# Patient Record
Sex: Female | Born: 1955 | Race: White | Hispanic: No | Marital: Single | Smoking: Never smoker
Health system: Southern US, Community
[De-identification: ages and names within clinical notes are randomized; demographics above are authoritative.]

## PROBLEM LIST (undated history)

## (undated) DIAGNOSIS — K219 Gastro-esophageal reflux disease without esophagitis: Secondary | ICD-10-CM

## (undated) DIAGNOSIS — E785 Hyperlipidemia, unspecified: Secondary | ICD-10-CM

## (undated) DIAGNOSIS — G8929 Other chronic pain: Secondary | ICD-10-CM

## (undated) DIAGNOSIS — M1990 Unspecified osteoarthritis, unspecified site: Secondary | ICD-10-CM

## (undated) DIAGNOSIS — F32A Depression, unspecified: Secondary | ICD-10-CM

## (undated) DIAGNOSIS — I341 Nonrheumatic mitral (valve) prolapse: Secondary | ICD-10-CM

## (undated) DIAGNOSIS — J302 Other seasonal allergic rhinitis: Secondary | ICD-10-CM

## (undated) DIAGNOSIS — R519 Headache, unspecified: Secondary | ICD-10-CM

## (undated) DIAGNOSIS — Z87820 Personal history of traumatic brain injury: Secondary | ICD-10-CM

## (undated) DIAGNOSIS — Z8489 Family history of other specified conditions: Secondary | ICD-10-CM

## (undated) DIAGNOSIS — R51 Headache: Secondary | ICD-10-CM

## (undated) DIAGNOSIS — F329 Major depressive disorder, single episode, unspecified: Secondary | ICD-10-CM

## (undated) DIAGNOSIS — S0300XA Dislocation of jaw, unspecified side, initial encounter: Secondary | ICD-10-CM

## (undated) DIAGNOSIS — I10 Essential (primary) hypertension: Secondary | ICD-10-CM

## (undated) HISTORY — DX: Headache, unspecified: R51.9

## (undated) HISTORY — DX: Headache: R51

## (undated) HISTORY — DX: Other chronic pain: G89.29

## (undated) HISTORY — PX: STRABISMUS SURGERY: SHX218

## (undated) HISTORY — PX: OTHER SURGICAL HISTORY: SHX169

---

## 2002-11-10 ENCOUNTER — Encounter: Payer: Self-pay | Admitting: Family Medicine

## 2004-10-28 HISTORY — PX: BREAST BIOPSY: SHX20

## 2007-01-15 ENCOUNTER — Encounter: Payer: Self-pay | Admitting: Family Medicine

## 2008-08-15 ENCOUNTER — Encounter: Payer: Self-pay | Admitting: Family Medicine

## 2008-09-07 ENCOUNTER — Encounter: Payer: Self-pay | Admitting: Family Medicine

## 2009-01-04 ENCOUNTER — Ambulatory Visit: Payer: Self-pay | Admitting: Family Medicine

## 2009-01-04 DIAGNOSIS — F3289 Other specified depressive episodes: Secondary | ICD-10-CM | POA: Insufficient documentation

## 2009-01-04 DIAGNOSIS — I10 Essential (primary) hypertension: Secondary | ICD-10-CM | POA: Insufficient documentation

## 2009-01-04 DIAGNOSIS — R519 Headache, unspecified: Secondary | ICD-10-CM | POA: Insufficient documentation

## 2009-01-04 DIAGNOSIS — R51 Headache: Secondary | ICD-10-CM | POA: Insufficient documentation

## 2009-01-04 DIAGNOSIS — R609 Edema, unspecified: Secondary | ICD-10-CM | POA: Insufficient documentation

## 2009-01-04 DIAGNOSIS — E785 Hyperlipidemia, unspecified: Secondary | ICD-10-CM | POA: Insufficient documentation

## 2009-01-04 DIAGNOSIS — J309 Allergic rhinitis, unspecified: Secondary | ICD-10-CM | POA: Insufficient documentation

## 2009-01-04 DIAGNOSIS — F329 Major depressive disorder, single episode, unspecified: Secondary | ICD-10-CM | POA: Insufficient documentation

## 2009-01-04 DIAGNOSIS — K219 Gastro-esophageal reflux disease without esophagitis: Secondary | ICD-10-CM | POA: Insufficient documentation

## 2009-01-04 LAB — CONVERTED CEMR LAB
Blood in Urine, dipstick: NEGATIVE
Glucose, Urine, Semiquant: NEGATIVE
Specific Gravity, Urine: 1.01
WBC Urine, dipstick: NEGATIVE
pH: 7

## 2009-01-04 LAB — HM COLONOSCOPY: HM Colonoscopy: NORMAL

## 2009-01-05 LAB — CONVERTED CEMR LAB
BUN: 10 mg/dL (ref 6–23)
Calcium: 9.1 mg/dL (ref 8.4–10.5)
Creatinine, Ser: 0.6 mg/dL (ref 0.4–1.2)
GFR calc Af Amer: 136 mL/min
GFR calc non Af Amer: 112 mL/min
Potassium: 3.6 meq/L (ref 3.5–5.1)

## 2009-01-11 ENCOUNTER — Ambulatory Visit: Payer: Self-pay | Admitting: Family Medicine

## 2009-02-08 ENCOUNTER — Ambulatory Visit: Payer: Self-pay | Admitting: Family Medicine

## 2009-02-20 ENCOUNTER — Telehealth: Payer: Self-pay | Admitting: Speech Pathology

## 2009-02-20 DIAGNOSIS — R141 Gas pain: Secondary | ICD-10-CM | POA: Insufficient documentation

## 2009-02-20 DIAGNOSIS — R143 Flatulence: Secondary | ICD-10-CM

## 2009-02-20 DIAGNOSIS — R142 Eructation: Secondary | ICD-10-CM

## 2009-02-27 ENCOUNTER — Encounter: Admission: RE | Admit: 2009-02-27 | Discharge: 2009-02-27 | Payer: Self-pay | Admitting: Family Medicine

## 2009-03-24 ENCOUNTER — Ambulatory Visit: Payer: Self-pay | Admitting: Internal Medicine

## 2009-05-02 ENCOUNTER — Telehealth: Payer: Self-pay | Admitting: Family Medicine

## 2009-06-28 ENCOUNTER — Ambulatory Visit: Payer: Self-pay | Admitting: Family Medicine

## 2009-08-03 ENCOUNTER — Ambulatory Visit: Payer: Self-pay | Admitting: Family Medicine

## 2009-08-03 LAB — CONVERTED CEMR LAB
Albumin: 4.3 g/dL (ref 3.5–5.2)
Basophils Absolute: 0.1 10*3/uL (ref 0.0–0.1)
Basophils Relative: 0.7 % (ref 0.0–3.0)
CO2: 29 meq/L (ref 19–32)
Chloride: 102 meq/L (ref 96–112)
Eosinophils Absolute: 0.8 10*3/uL — ABNORMAL HIGH (ref 0.0–0.7)
Glucose, Bld: 88 mg/dL (ref 70–99)
HCT: 40 % (ref 36.0–46.0)
HDL: 63 mg/dL (ref >39.00)
Hemoglobin, Urine: NEGATIVE
Hemoglobin: 13.4 g/dL (ref 12.0–15.0)
Ketones, ur: NEGATIVE mg/dL
Leukocytes, UA: NEGATIVE
Lymphs Abs: 2.5 10*3/uL (ref 0.7–4.0)
MCHC: 33.4 g/dL (ref 30.0–36.0)
MCV: 92.3 fL (ref 78.0–100.0)
Neutro Abs: 9.5 10*3/uL — ABNORMAL HIGH (ref 1.4–7.7)
Potassium: 3.7 meq/L (ref 3.5–5.1)
RDW: 12.7 % (ref 11.5–14.6)
Sodium: 133 meq/L — ABNORMAL LOW (ref 135–145)
Specific Gravity, Urine: 1.015 (ref 1.000–1.030)
TSH: 1.34 microintl units/mL (ref 0.35–5.50)
Total Protein: 8 g/dL (ref 6.0–8.3)
Urobilinogen, UA: 0.2 (ref 0.0–1.0)

## 2009-08-04 ENCOUNTER — Ambulatory Visit: Payer: Self-pay | Admitting: Family Medicine

## 2009-08-04 DIAGNOSIS — D72829 Elevated white blood cell count, unspecified: Secondary | ICD-10-CM | POA: Insufficient documentation

## 2009-08-07 ENCOUNTER — Telehealth: Payer: Self-pay | Admitting: Family Medicine

## 2009-08-07 ENCOUNTER — Ambulatory Visit: Payer: Self-pay | Admitting: Family Medicine

## 2009-08-07 LAB — CONVERTED CEMR LAB
Basophils Absolute: 0 10*3/uL (ref 0.0–0.1)
Basophils Relative: 0.5 % (ref 0.0–3.0)
Eosinophils Absolute: 0.5 10*3/uL (ref 0.0–0.7)
Lymphocytes Relative: 20.1 % (ref 12.0–46.0)
MCHC: 34 g/dL (ref 30.0–36.0)
MCV: 92 fL (ref 78.0–100.0)
Monocytes Absolute: 0.6 10*3/uL (ref 0.1–1.0)
Neutrophils Relative %: 67.6 % (ref 43.0–77.0)
Platelets: 228 10*3/uL (ref 150.0–400.0)
RBC: 3.94 M/uL (ref 3.87–5.11)

## 2009-08-17 ENCOUNTER — Ambulatory Visit: Payer: Self-pay | Admitting: Family Medicine

## 2009-08-17 DIAGNOSIS — M810 Age-related osteoporosis without current pathological fracture: Secondary | ICD-10-CM | POA: Insufficient documentation

## 2009-09-04 ENCOUNTER — Telehealth: Payer: Self-pay | Admitting: Family Medicine

## 2009-10-23 ENCOUNTER — Telehealth: Payer: Self-pay | Admitting: Family Medicine

## 2010-01-15 ENCOUNTER — Ambulatory Visit: Payer: Self-pay | Admitting: Family Medicine

## 2010-01-15 DIAGNOSIS — S0990XA Unspecified injury of head, initial encounter: Secondary | ICD-10-CM | POA: Insufficient documentation

## 2010-02-02 ENCOUNTER — Telehealth: Payer: Self-pay | Admitting: Family Medicine

## 2010-02-02 ENCOUNTER — Encounter (INDEPENDENT_AMBULATORY_CARE_PROVIDER_SITE_OTHER): Payer: Self-pay | Admitting: *Deleted

## 2010-02-02 ENCOUNTER — Telehealth: Payer: Self-pay | Admitting: Gastroenterology

## 2010-02-02 DIAGNOSIS — K625 Hemorrhage of anus and rectum: Secondary | ICD-10-CM | POA: Insufficient documentation

## 2010-02-07 ENCOUNTER — Ambulatory Visit: Payer: Self-pay | Admitting: Gastroenterology

## 2010-02-07 DIAGNOSIS — K648 Other hemorrhoids: Secondary | ICD-10-CM | POA: Insufficient documentation

## 2010-02-12 ENCOUNTER — Ambulatory Visit: Payer: Self-pay | Admitting: Family Medicine

## 2010-03-13 ENCOUNTER — Telehealth: Payer: Self-pay | Admitting: Family Medicine

## 2010-03-15 ENCOUNTER — Ambulatory Visit: Payer: Self-pay | Admitting: Family Medicine

## 2010-06-29 ENCOUNTER — Ambulatory Visit: Payer: Self-pay | Admitting: Family Medicine

## 2010-08-07 ENCOUNTER — Ambulatory Visit: Payer: Self-pay | Admitting: Family Medicine

## 2010-08-07 DIAGNOSIS — L501 Idiopathic urticaria: Secondary | ICD-10-CM | POA: Insufficient documentation

## 2010-09-17 ENCOUNTER — Telehealth: Payer: Self-pay | Admitting: Family Medicine

## 2010-09-19 ENCOUNTER — Telehealth: Payer: Self-pay | Admitting: Family Medicine

## 2010-10-12 ENCOUNTER — Encounter: Payer: Self-pay | Admitting: Family Medicine

## 2010-10-12 LAB — HM MAMMOGRAPHY: HM Mammogram: NORMAL

## 2010-11-27 ENCOUNTER — Telehealth: Payer: Self-pay | Admitting: Family Medicine

## 2010-11-27 NOTE — Progress Notes (Signed)
Summary: referral  Phone Note Call from Patient   Caller: Patient Call For: Evelena Peat MD Summary of Call: Pt wants referral to a specialist for hemorrhoids.  Please call at [phone] Initial call taken by: Lynann Beaver CMA,  February 02, 2010 10:42 AM  Follow-up for Phone Call        OK to refer-if pt has no preference,  [facility]. Follow-up by: Evelena Peat MD,  February 02, 2010 10:46 AM  New Problems: RECTAL BLEEDING (ICD-569.3)   New Problems: RECTAL BLEEDING (ICD-569.3)

## 2010-11-27 NOTE — Assessment & Plan Note (Signed)
Summary: HEMORRHOIDS,RECTAL BLEEDING    (NEW TO GI)   Gabrielle Brock   History of Present Illness Visit Type: Initial Consult Primary GI MD: Melvia Heaps MD [facility] Primary Provider: Evelena Peat, MD Requesting Provider: Evelena Peat, MD Chief Complaint: rectal bleed, hemorrhoids History of Present Illness:   55 YO FEMALE NEW TO GI TODAY WITH C/O RECTAL BLEEDING. SHE RELATES HX OF INTERNAL HEMORRHOIDS AND HAD UNDERGONE A BANDING PROCEDURE IN [place] SEVERAL YEARS AGO. SHE HAD A COLONOSCOPY IN 10/2002 IN [place](LETTER IN HER CHART) WHICH WAS NORMAL,EXCEPT FOR SMALL INT. HEMORRHOIDS. SHE HAD AN EPISODE ABOUT A WEEK AGO AFTER A LARGE BM WITH  A SMALL AMT OF BRB PER RECTUM. SHE SAW A SMALL AMT OF BLOOD WITH BM'S FOR 3 DAYS THEN IT RESOLVED. SHE HAS NO C/O RECTAL DISCOMFORT,OR ABDOMINAL DISCOMFORT. BM'S NORMAL. NO REGULAR ASA OR NSAIDS. NO FAMILY HX OF COLON CANCER.   GI Review of Systems      Denies abdominal pain, acid reflux, belching, bloating, chest pain, dysphagia with liquids, dysphagia with solids, heartburn, loss of appetite, nausea, vomiting, vomiting blood, and  weight loss.      Reports hemorrhoids and  rectal bleeding.     Denies anal fissure, black tarry stools, change in bowel habit, constipation, diarrhea, diverticulosis, fecal incontinence, heme positive stool, irritable bowel syndrome, jaundice, light color stool, liver problems, and  rectal pain.    Current Medications (verified): 1)  Allegra 60 Mg Tabs (Fexofenadine Hcl) .... Once Daily 2)  Nexium 40 Mg Cpdr (Esomeprazole Magnesium) .... Once Daily 3)  Multi For Her 50+  Tabs (Multiple Vitamins-Minerals) .... Once Daily 4)  Ambien 10 Mg Tabs (Zolpidem Tartrate) .... As Needed 5)  Fioricet 50-325-40 Mg Tabs (Butalbital-Apap-Caffeine) .... As Needed 6)  Xanax 0.25 Mg Tabs (Alprazolam) .... 2 and 1/2 At Bedtime 7)  Nasacort Aq 55 Mcg/act Aers (Triamcinolone Acetonide(Nasal)) .... Qd 8)  Benicar 20 Mg Tabs (Olmesartan  Medoxomil) 9)  Boniva 150 Mg Tabs (Ibandronate Sodium) .... One By Mouth Q Month 10)  Hydrochlorothiazide 25 Mg Tabs (Hydrochlorothiazide) .... One Half Tablet Once Daily As Needed Edema 11)  Maprotiline Hcl 75 Mg Tabs (Maprotiline Hcl) .... Once Daily  Allergies (verified): 1)  Ceclor  Past History:  Past Medical History: Allergic rhinitis Depression GERD Hyperlipidemia Hypertension Migraines Osteoporosis Chronic Headaches internal hemorrhoids  Past Surgical History: Torn Retina Breast Biopsy  Family History: Reviewed history from 01/04/2009 and no changes required. Family History High cholesterol Family History Hypertension Family History of Cardiovascular disorder Parkinsons  father Alzhemiers  mother  Social History: Reviewed history from 01/04/2009 and no changes required. Occupation:  Designer, multimedia Single Never Smoked Alcohol use-no Regular exercise-yes  Review of Systems       The patient complains of allergy/sinus, depression-new, headaches-new, hearing problems, nosebleeds, and sore throat.  The patient denies anemia, anxiety-new, arthritis/joint pain, back pain, blood in urine, breast changes/lumps, change in vision, confusion, cough, coughing up blood, fainting, fatigue, heart murmur, heart rhythm changes, itching, menstrual pain, muscle pains/cramps, night sweats, pregnancy symptoms, shortness of breath, skin rash, sleeping problems, swelling of feet/legs, swollen lymph glands, thirst - excessive , urination - excessive , urination changes/pain, urine leakage, vision changes, and voice change.         ros otherwise as in hpi  Vital Signs:  Patient profile:   55 year old female Menstrual status:  regular Height:      62 inches Weight:      108 pounds BMI:  19.82 Pulse rate:   92 / minute Pulse rhythm:   regular BP sitting:   110 / 74  (left arm) Cuff size:   regular  Vitals Entered By: June McMurray CMA Duncan Dull) (February 07, 2010 8:52  AM)  Physical Exam  General:  Well developed, well nourished, no acute distress.,THIN Head:  Normocephalic and atraumatic. Eyes:  PERRLA, no icterus. Lungs:  Clear throughout to auscultation. Heart:  Regular rate and rhythm; no murmurs, rubs,  or bruits. Abdomen:  SOFT, NONTENDER, NO MASS OR HSM,BS+ Rectal:  SMALL EXTERNAL TAG, NO INTERNAL LESION FELT, NONTENDER EXAM,STOOL HEME NEGATIVE Extremities:  No clubbing, cyanosis, edema or deformities noted. Neurologic:  Alert and  oriented x4;  grossly normal neurologically. Psych:  Alert and cooperative. Normal mood and affect.anxious.     Impression & Recommendations:  Problem # 1:  RECTAL BLEEDING (ICD-569.3) Assessment New 55 YO FEMALE WITH HX OF PREVIOUSLY DOCUMENTED INTERNAL HEMORRHOIDS ON COLONOSCOPY 2004 (DONE IN [place]), WITH EPISODE OF SMALL VOLUME RECTAL BLEEDING,VERY LIKELY SECONDARY TO HEMORRHOID.  PT INITIALLY EXPRESSED DESIRE TO UNDERGO  A BANDING PROCEDURE-HOWEVER, SHE IS CURRENTLY ASYMPTOMATIC. DO NOT FEEL SHE NEEDS A BANDING AT THIS TIME,DISCUSSED TRIAL OF as needed ANUSOL HC SUPP. IF SHE HAS PROBLEMS WITH PERSISTENT BLEEDING OR DISCOMFORT THEN WILL NEED COLONOSCOPY REPEATED AND CONSIDER BANDING AT THAT TIME. SHE WILL CALL IF SHE HAS RECURRENT PROBLEMS.  Patient Instructions: 1)  Return for recurrent bleeding with Dr. Arlyce Dice or Mike Gip PA. 2)  Get Hydrocortisone Suppositories at your pharmacy, use for 5-6 nights before bedtime.  3)  The medication list was reviewed and reconciled.  All changed / newly prescribed medications were explained.  A complete medication list was provided to the patient / caregiver. 4)  Hemorrhoids brochure given.  5)  Copy sent to : Evelena Peat, MD
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
## 2010-11-27 NOTE — Progress Notes (Signed)
Summary: refill and new rx dose request  Phone Note Refill Request Call back at [phone] Message from:  Patient---live call  Refills Requested: Medication #1:  BENICAR 20 MG TABS send to [facility] (Benicar) on Nexium 40mg . would like to try 20mg  1qd. Please call this in to [facility] in [place].  Initial call taken by: Warnell Forester,  Mar 13, 2010 1:49 PM  Follow-up for Phone Call        OK to try Nexium 20 mg by mouth once daily with 6 refills. Follow-up by: Evelena Peat MD,  Mar 13, 2010 10:12 PM    New/Updated Medications: NEXIUM 20 MG CPDR (ESOMEPRAZOLE MAGNESIUM) once daily BENICAR 20 MG TABS (OLMESARTAN MEDOXOMIL) once daily Prescriptions: BENICAR 20 MG TABS (OLMESARTAN MEDOXOMIL) once daily  #90 x 3   Entered by:   Sid Falcon LPN   Authorized by:   Evelena Peat MD   Signed by:   Sid Falcon LPN on 09/81/1914   Method used:   Faxed to ...       [facility] (mail[facility]order)       [address] [place], [place]  [address]       Ph: [phone]       Fax: [phone]   RxID:   [id number] BENICAR 20 MG TABS (OLMESARTAN MEDOXOMIL) once daily  #30 x 6   Entered by:   Sid Falcon LPN   Authorized by:   Evelena Peat MD   Signed by:   Sid Falcon LPN on 40/34/7425   Method used:   Faxed to ...       [facility] (mail[facility]order)       [address] [place], [place]  [address]       Ph: [phone]       Fax: [phone]   RxID:   [id number] NEXIUM 20 MG CPDR (ESOMEPRAZOLE MAGNESIUM) once daily  #30 x 6   Entered by:   Sid Falcon LPN   Authorized by:   Evelena Peat MD   Signed by:   Sid Falcon LPN on 25/42/7062   Method used:   Faxed to ...       [facility] (mail[facility]order)       [address] [place], [place]  [address]       Ph: [phone]       Fax: [phone]   RxID:   [id number] BENICAR 20 MG TABS (OLMESARTAN MEDOXOMIL) once daily  #30 x 6   Entered by:   Sid Falcon LPN   Authorized by:   Evelena Peat MD   Signed by:   Sid Falcon LPN on 99/37/1696   Method used:   Electronically to        [facility]* (retail)       [address] [place], [place]  [address]       Ph: [phone]       Fax: [phone]   RxID:   [id number] NEXIUM 20 MG CPDR (ESOMEPRAZOLE MAGNESIUM) once daily  #30 x 6   Entered by:   Sid Falcon LPN   Authorized by:   Evelena Peat MD   Signed by:   Sid Falcon LPN on 95/06/3266   Method used:   Electronically to        [facility]* (retail)       [address], [place]  [address]       Ph: [phone]       Fax: [phone]   RxID:   [id number]  Rx sent to [facility] by mistake, resent to [facility] as requested Sid Falcon LPN  Mar 14, 2010 9:26 AM  [facility], cancelled 30 day supply Nexium and benicar, faxed refill Benicar #90, RF 3 Sid Falcon LPN  Mar 14, 2010 10:54 AM

## 2010-11-27 NOTE — Progress Notes (Signed)
Summary: REFILL REQUEST Nexium to [facility]  Phone Note Refill Request   Refills Requested: Medication #1:  NEXIUM 40 MG CPDR once daily   Notes: 90-DAY SUPPLY..... [facility].    Initial call taken by: Debbra Riding,  September 17, 2010 10:06 AM    Prescriptions: NEXIUM 20 MG CPDR (ESOMEPRAZOLE MAGNESIUM) once daily  #90 x 3   Entered by:   Sid Falcon LPN   Authorized by:   Evelena Peat MD   Signed by:   Sid Falcon LPN on 04/54/0981   Method used:   Faxed to ...       [facility] (mail[facility]order)       [address] [place], [place]  [address]       Ph: [phone]       Fax: [phone]   RxID:   [id number]

## 2010-11-27 NOTE — Assessment & Plan Note (Signed)
Summary: H/A, NAUSEA / DIARRHEA // RS   Vital Signs:  Patient profile:   55 year old female Menstrual status:  regular Temp:     98.0 degrees F BP sitting:   110 / 72  History of Present Illness: Patient here with acute illness. Started yesterday with some nausea but no vomiting. She has about 4 loose stools per day. No bloody stools. Has had fever up around 101.  Mild associated headaches. No ill contacts. No recent travels.  Took Imodium this morning. Symptoms are moderately severe.  Keeping down some fluids.  Has some diffuse abdominal cramps but no localizing pain.  Yesterday while eating some lemon flavored Jell-O she had some transient swelling and itching right upper lip. No prior history of food allergy. Did not have any rash or hives.  Symptoms resolved fairly quick ly with Allegra.   No cough or wheeze.  Allergies: 1)  Ceclor  Past History:  Past Medical History: Last updated: 02/07/2010 Allergic rhinitis Depression GERD Hyperlipidemia Hypertension Migraines Osteoporosis Chronic Headaches internal hemorrhoids PMH reviewed for relevance  Review of Systems  The patient denies anorexia, fever, weight loss, hoarseness, chest pain, syncope, dyspnea on exertion, peripheral edema, prolonged cough, hemoptysis, melena, hematochezia, severe indigestion/heartburn, and suspicious skin lesions.    Physical Exam  General:  Well-developed,well-nourished,in no acute distress; alert,appropriate and cooperative throughout examination Ears:  External ear exam shows no significant lesions or deformities.  Otoscopic examination reveals clear canals, tympanic membranes are intact bilaterally without bulging, retraction, inflammation or discharge. Hearing is grossly normal bilaterally. Mouth:  no evidence for any edema or any rashes or erythema. Mucosa is moist Neck:  No deformities, masses, or tenderness noted. Lungs:  Normal respiratory effort, chest expands symmetrically. Lungs are  clear to auscultation, no crackles or wheezes. Heart:  Normal rate and regular rhythm. S1 and S2 normal without gallop, murmur, click, rub or other extra sounds. Abdomen:  soft, non-tender, normal bowel sounds, no distention, no masses, no hepatomegaly, and no splenomegaly.   Skin:  no rashes and no suspicious lesions.   Cervical Nodes:  No lymphadenopathy noted Psych:  normally interactive, good eye contact, not anxious appearing, and not depressed appearing.     Impression & Recommendations:  Problem # 1:  GASTROENTERITIS, VIRAL (ICD-008.8) Assessment New Phenergan prescribed.  Clear fluids.  Cont Imodium as needed.  Problem # 2:  ALLERGIC REACTION (ICD-995.3) Assessment: New ?food allergy to jello.  Pt will be in touch if any recurrent symptoms.  cont antihistamine as needed.  Complete Medication List: 1)  Allegra 60 Mg Tabs (Fexofenadine hcl) .... Once daily 2)  Nexium 20 Mg Cpdr (Esomeprazole magnesium) .... Once daily 3)  Multi For Her 50+ Tabs (Multiple vitamins-minerals) .... Once daily 4)  Ambien 10 Mg Tabs (Zolpidem tartrate) .... As needed 5)  Fioricet 50-325-40 Mg Tabs (Butalbital-apap-caffeine) .... As needed 6)  Xanax 0.25 Mg Tabs (Alprazolam) .... 2 and 1/2 at bedtime 7)  Nasacort Aq 55 Mcg/act Aers (Triamcinolone acetonide(nasal)) .... Qd 8)  Benicar 20 Mg Tabs (Olmesartan medoxomil) .... Once daily 9)  Boniva 150 Mg Tabs (Ibandronate sodium) .... One by mouth q month 10)  Hydrochlorothiazide 25 Mg Tabs (Hydrochlorothiazide) .... One half tablet once daily as needed edema 11)  Maprotiline Hcl 75 Mg Tabs (Maprotiline hcl) .... Once daily 12)  Astelin 137 Mcg/spray Soln (Azelastine hcl) .Marland Kitchen.. 1-2 sprays per nostril two times a day as needed allergies 13)  Promethazine Hcl 25 Mg Tabs (Promethazine hcl) .... One by mouth  q 6 hours as needed nausea and vomiting  Patient Instructions: 1)  The main problem with gastroentereritis is dehydration. Drink plenty of fluids and  take solids as you feel better. If you are unable to keep anything down and/or you show signs of dehydration( dry cracked lips, lack of tears, not urinating, very sleepy) , call our office.  2)  Try some clear soups, bland foods, bananas, and gatorade. Prescriptions: PROMETHAZINE HCL 25 MG TABS (PROMETHAZINE HCL) one by mouth q 6 hours as needed nausea and vomiting  #10 x 0   Entered and Authorized by:   Evelena Peat MD   Signed by:   Evelena Peat MD on 06/29/2010   Method used:   Print then Give to Patient   RxID:   [id number]

## 2010-11-27 NOTE — Assessment & Plan Note (Signed)
Summary: BLOATING, ACID RELUX // RS   Vital Signs:  Patient profile:   55 year old female Menstrual status:  regular Weight:      108 pounds Temp:     98.4 degrees F oral BP sitting:   110 / 70  (left arm) Cuff size:   regular  Vitals Entered By: Sid Falcon LPN (Mar 15, 2010 8:53 AM) CC: bloating stomach problems   History of Present Illness: Tuesday night increased typical reflux symptoms.   feels bloated.  no nausea or vomiting. Chronic mild constipation unchanged.  Colonoscopy 2004 normal. Denies bloody stools, fever, dysuria.  Just started menses.  Reflux symptoms improved today.  Allergies: 1)  Ceclor  Past History:  Past Medical History: Last updated: 02/07/2010 Allergic rhinitis Depression GERD Hyperlipidemia Hypertension Migraines Osteoporosis Chronic Headaches internal hemorrhoids  Review of Systems  The patient denies anorexia, fever, weight loss, melena, and hematochezia.    Physical Exam  General:  Well-developed,well-nourished,in no acute distress; alert,appropriate and cooperative throughout examination Lungs:  Normal respiratory effort, chest expands symmetrically. Lungs are clear to auscultation, no crackles or wheezes. Heart:  Normal rate and regular rhythm. S1 and S2 normal without gallop, murmur, click, rub or other extra sounds. Abdomen:  Bowel sounds positive,abdomen soft and non-tender without masses, organomegaly or hernias noted.   Impression & Recommendations:  Problem # 1:  GERD (ICD-530.81) We discussed trial of tapering down Nexium dose and eventual trial of H2 blocker given her RFs for progressive osteoporosis. Her updated medication list for this problem includes:    Nexium 20 Mg Cpdr (Esomeprazole magnesium) ..... Once daily  Complete Medication List: 1)  Allegra 60 Mg Tabs (Fexofenadine hcl) .... Once daily 2)  Nexium 20 Mg Cpdr (Esomeprazole magnesium) .... Once daily 3)  Multi For Her 50+ Tabs (Multiple  vitamins-minerals) .... Once daily 4)  Ambien 10 Mg Tabs (Zolpidem tartrate) .... As needed 5)  Fioricet 50-325-40 Mg Tabs (Butalbital-apap-caffeine) .... As needed 6)  Xanax 0.25 Mg Tabs (Alprazolam) .... 2 and 1/2 at bedtime 7)  Nasacort Aq 55 Mcg/act Aers (Triamcinolone acetonide(nasal)) .... Qd 8)  Benicar 20 Mg Tabs (Olmesartan medoxomil) .... Once daily 9)  Boniva 150 Mg Tabs (Ibandronate sodium) .... One by mouth q month 10)  Hydrochlorothiazide 25 Mg Tabs (Hydrochlorothiazide) .... One half tablet once daily as needed edema 11)  Maprotiline Hcl 75 Mg Tabs (Maprotiline hcl) .... Once daily 12)  Astelin 137 Mcg/spray Soln (Azelastine hcl) .Marland Kitchen.. 1-2 sprays per nostril two times a day as needed allergies  Patient Instructions: 1)  Try swithching to Nexium 20 mg by mouth daily. 2)  If symptoms controlled on Nexium at lower dose for 1-2 months, consider trial of Zantac or Pepcid.

## 2010-11-27 NOTE — Assessment & Plan Note (Signed)
Summary: allergies/dm/pt rsc/cjr   Vital Signs:  Patient profile:   55 year old female Menstrual status:  regular Temp:     98.7 degrees F oral BP sitting:   140 / 80  (left arm) Cuff size:   regular  Vitals Entered By: Sid Falcon LPN (February 12, 2010 4:01 PM) CC: allergies   History of Present Illness: Long history of allergies. Recent increased nasal congestive symptoms past several days. Mostly clear mucus. On Nasacort and Allegra without much improvement. No purulent nasal discharge. Denies any fevers or chills. Only occasional cough. Sore throat symptoms off and on.  Allergies: 1)  Ceclor  Past History:  Past Medical History: Last updated: 02/07/2010 Allergic rhinitis Depression GERD Hyperlipidemia Hypertension Migraines Osteoporosis Chronic Headaches internal hemorrhoids  Review of Systems      See HPI  Physical Exam  General:  Well-developed,well-nourished,in no acute distress; alert,appropriate and cooperative throughout examination Ears:  External ear exam shows no significant lesions or deformities.  Otoscopic examination reveals clear canals, tympanic membranes are intact bilaterally without bulging, retraction, inflammation or discharge. Hearing is grossly normal bilaterally. Nose:  clear nasal mucus bilaterally Mouth:  Oral mucosa and oropharynx without lesions or exudates.  Teeth in good repair. Neck:  No deformities, masses, or tenderness noted. Lungs:  Normal respiratory effort, chest expands symmetrically. Lungs are clear to auscultation, no crackles or wheezes.   Impression & Recommendations:  Problem # 1:  ALLERGIC RHINITIS (ICD-477.9) add astelin.  Add singulair if no better one week. Her updated medication list for this problem includes:    Allegra 60 Mg Tabs (Fexofenadine hcl) ..... Once daily    Nasacort Aq 55 Mcg/act Aers (Triamcinolone acetonide(nasal)) ..... Qd    Astelin 137 Mcg/spray Soln (Azelastine hcl) .Marland Kitchen... 1-2 sprays per  nostril two times a day as needed allergies  Complete Medication List: 1)  Allegra 60 Mg Tabs (Fexofenadine hcl) .... Once daily 2)  Nexium 40 Mg Cpdr (Esomeprazole magnesium) .... Once daily 3)  Multi For Her 50+ Tabs (Multiple vitamins-minerals) .... Once daily 4)  Ambien 10 Mg Tabs (Zolpidem tartrate) .... As needed 5)  Fioricet 50-325-40 Mg Tabs (Butalbital-apap-caffeine) .... As needed 6)  Xanax 0.25 Mg Tabs (Alprazolam) .... 2 and 1/2 at bedtime 7)  Nasacort Aq 55 Mcg/act Aers (Triamcinolone acetonide(nasal)) .... Qd 8)  Benicar 20 Mg Tabs (Olmesartan medoxomil) 9)  Boniva 150 Mg Tabs (Ibandronate sodium) .... One by mouth q month 10)  Hydrochlorothiazide 25 Mg Tabs (Hydrochlorothiazide) .... One half tablet once daily as needed edema 11)  Maprotiline Hcl 75 Mg Tabs (Maprotiline hcl) .... Once daily 12)  Astelin 137 Mcg/spray Soln (Azelastine hcl) .Marland Kitchen.. 1-2 sprays per nostril two times a day as needed allergies  Patient Instructions: 1)  call or followup if you develop any increased facial pain, fever, or worsening nasal congestive symptoms Prescriptions: ASTELIN 137 MCG/SPRAY SOLN (AZELASTINE HCL) 1-2 sprays per nostril two times a day as needed allergies  #1 x 6   Entered and Authorized by:   Evelena Peat MD   Signed by:   Evelena Peat MD on 02/12/2010   Method used:   Print then Give to Patient   RxID:   [id number] ASTELIN 137 MCG/SPRAY SOLN (AZELASTINE HCL) 1-2 sprays per nostril two times a day as needed allergies  #1 x 6   Entered and Authorized by:   Evelena Peat MD   Signed by:   Evelena Peat MD on 02/12/2010   Method used:   Electronically to  [facility]* (retail)       [address] [place], [place]  [address]       Ph: [phone]       Fax: [phone]   RxID:   [id number]

## 2010-11-27 NOTE — Letter (Signed)
Summary: New Patient letter  [facility]  [address] [place], [place] [address]   Phone: [phone]  Fax: [phone]       02/02/2010 MRN: [id number]  [facility] [address] [place], [place]  [address]  Dear Gabrielle Brock,  Welcome to the Gastroenterology Division at [facility].    You are scheduled to see Dr. Arlyce Dice on 03-05-10 at 3:30p.m. on the 3rd floor at [facility], [address]  We ask that you try to arrive at our office 15 minutes prior to your appointment time to allow for check-in.  We would like you to complete the enclosed self-administered evaluation form prior to your visit and bring it with you on the day of your appointment.  We will review it with you.  Also, please bring a complete list of all your medications or, if you prefer, bring the medication bottles and we will list them.  Please bring your insurance card so that we may make a copy of it.  If your insurance requires a referral to see a specialist, please bring your referral form from your primary care physician.  Co-payments are due at the time of your visit and may be paid by cash, check or credit card.     Your office visit will consist of a consult with your physician (includes a physical exam), any laboratory testing he/she may order, scheduling of any necessary diagnostic testing (e.g. x-ray, ultrasound, CT-scan), and scheduling of a procedure (e.g. Endoscopy, Colonoscopy) if required.  Please allow enough time on your schedule to allow for any/all of these possibilities.    If you cannot keep your appointment, please call [phone] to cancel or reschedule prior to your appointment date.  This allows [place] the opportunity to schedule an appointment for another patient in need of care.  If you do not cancel or reschedule by 5 p.m. the business day prior to your appointment date, you will be charged a $50.00 late cancellation/no-show fee.    Thank you for choosing   Gastroenterology for your medical needs.  We appreciate the opportunity to care for you.  Please visit [place] at our website  to learn more about our practice.                     Sincerely,                                                             The Gastroenterology Division

## 2010-11-27 NOTE — Progress Notes (Signed)
Summary: TRIAGE-hemorrhoids  Phone Note Call from Patient Call back at [phone]  or [phone]   Caller: Patient Call For: Dr. Arlyce Dice Reason for Call: Talk to Nurse Summary of Call: pt has an appt for hemorrhoids and wants to know what Dr. Arlyce Dice will be able to do during an office visit to treat hemorrhoids Initial call taken by: Vallarie Mare,  February 02, 2010 1:35 PM  Follow-up for Phone Call        Message left for patient to callback. Laureen Ochs LPN  February 02, 4402 1:51 PM  Pt. has a hx. of hem. banding. She is currently having rectal bleeding, wants a sooner appt. She will see [facility] on 02-07-10 at 8:30am. Anusol  supp. 1 pr qhs x 7-10 nights,sitz bath TID,Tucks pads to rectum TID,use baby wipes instead of toilet paper. Pt. instructed to call back as needed.   Follow-up by: Laureen Ochs LPN,  February 02, 2010 3:16 PM

## 2010-11-27 NOTE — Progress Notes (Signed)
Summary: REQUEST FOR RX (Nexium 40 mg to [facility])  Phone Note Call from Patient   Caller: Patient  [phone] Summary of Call: Pt called to adv that the Nexium Rx that she was given was supposed to be for 40 mg instead of 20 mg.... Pt states that [facility] has already sent the med: Nexium 20mg  but she will return the med to them once she is back in town... Marland KitchenHowever, pt will need a script for the correct dosage of Nexium 40mg  sent to [facility].  Initial call taken by: Debbra Riding,  September 19, 2010 4:15 PM  Follow-up for Phone Call        was the to be increased to 40mg ? I only see 20mg  rx's.  please advise Follow-up by: Duard Brady LPN,  September 19, 2010 4:44 PM  Additional Follow-up for Phone Call Additional follow up Details #1::        I will increase to 40 mg Additional Follow-up by: Evelena Peat MD,  September 19, 2010 4:48 PM    New/Updated Medications: NEXIUM 40 MG CPDR (ESOMEPRAZOLE MAGNESIUM) one by mouth once daily Prescriptions: NEXIUM 40 MG CPDR (ESOMEPRAZOLE MAGNESIUM) one by mouth once daily  #90 x 3   Entered and Authorized by:   Evelena Peat MD   Signed by:   Evelena Peat MD on 09/19/2010   Method used:   Faxed to ...       [facility]       [address] [place], [place]  [address]       Ph: [phone]       Fax: [phone]   RxID:   [id number]

## 2010-11-27 NOTE — Assessment & Plan Note (Signed)
Summary: HIVES AGAIN/ALLERGIC REACTION ADVIL?/PS   Vital Signs:  Patient profile:   55 year old female Menstrual status:  regular Weight:      109 pounds Temp:     98.4 degrees F oral BP sitting:   138 / 80  (left arm) Cuff size:   regular  Vitals Entered By: Sid Falcon LPN (August 07, 2010 5:21 PM)  History of Present Illness: Patient seen with recent allergic type reaction. She describes some mild swelling upper lip which occurred about 2 hours after taking some Advil. Had somewhat similar reaction a couple of months ago after taking some Advil and gelatin. She was not sure at that time which may have precipitated. She did not consume Advil or gelatin since her last reaction. Patient took antihistamine and symptoms promptly subsided. She had some associated hives involving the upper neck region. No generalized urticaria. She denied any tongue edema or dyspnea.  symptoms fully cleared at this time  Patient has history of seasonal allergies and takes Astelin nasal and oral antihistamine with good control.  Allergies: 1)  Ceclor  Past History:  Past Medical History: Last updated: 02/07/2010 Allergic rhinitis Depression GERD Hyperlipidemia Hypertension Migraines Osteoporosis Chronic Headaches internal hemorrhoids  Review of Systems  The patient denies fever, weight loss, chest pain, syncope, dyspnea on exertion, and headaches.    Physical Exam  General:  Well-developed,well-nourished,in no acute distress; alert,appropriate and cooperative throughout examination Ears:  External ear exam shows no significant lesions or deformities.  Otoscopic examination reveals clear canals, tympanic membranes are intact bilaterally without bulging, retraction, inflammation or discharge. Hearing is grossly normal bilaterally. Mouth:  Oral mucosa and oropharynx without lesions or exudates.  Teeth in good repair. Neck:  No deformities, masses, or tenderness noted. Lungs:  Normal  respiratory effort, chest expands symmetrically. Lungs are clear to auscultation, no crackles or wheezes. Heart:  Normal rate and regular rhythm. S1 and S2 normal without gallop, murmur, click, rub or other extra sounds. Skin:  no rashes.     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 Questionably related to Advil. EpiPen prescribed. She is encouraged to keep this with her at all times. We have advised repeat followup with allergist  Complete Medication List: 1)  Allegra 60 Mg Tabs (Fexofenadine hcl) .... Once daily 2)  Nexium 20 Mg Cpdr (Esomeprazole magnesium) .... Once daily 3)  Multi For Her 50+ Tabs (Multiple vitamins-minerals) .... Once daily 4)  Ambien 10 Mg Tabs (Zolpidem tartrate) .... As needed 5)  Fioricet 50-325-40 Mg Tabs (Butalbital-apap-caffeine) .... As needed 6)  Xanax 0.25 Mg Tabs (Alprazolam) .... 2 and 1/2 at bedtime 7)  Nasacort Aq 55 Mcg/act Aers (Triamcinolone acetonide(nasal)) .... Qd 8)  Benicar 20 Mg Tabs (Olmesartan medoxomil) .... Once daily 9)  Boniva 150 Mg Tabs (Ibandronate sodium) .... One by mouth q month 10)  Hydrochlorothiazide 25 Mg Tabs (Hydrochlorothiazide) .... One half tablet once daily as needed edema 11)  Maprotiline Hcl 75 Mg Tabs (Maprotiline hcl) .... Once daily 12)  Astelin 137 Mcg/spray Soln (Azelastine hcl) .Marland Kitchen.. 1-2 sprays per nostril two times a day as needed allergies 13)  Promethazine Hcl 25 Mg Tabs (Promethazine hcl) .... One by mouth q 6 hours as needed nausea and vomiting 14)  Epipen 2-pak 0.3 Mg/0.26ml Devi (Epinephrine) .... Use as directed as needed  Other Orders: Admin 1st Vaccine ([address]) Flu Vaccine 80yrs + ([address])  Patient Instructions: 1)  Carry Epipen and antihistamine  with you at all times with travel. 2)  Consider additional allergy testing with allergist. Prescriptions: EPIPEN 2-PAK 0.3 MG/0.3ML DEVI (EPINEPHRINE) use as directed as needed   #1 x 1   Entered and Authorized by:   Evelena Peat MD   Signed by:   Evelena Peat MD on 08/07/2010   Method used:   Electronically to        [facility]* (retail)       [address] [place], [place]  [address]       Ph: [phone]       Fax: [phone]   RxID:   [id number]   Flu Vaccine Consent Questions     Do you have a history of severe allergic reactions to this vaccine? no    Any prior history of allergic reactions to egg and/or gelatin? no    Do you have a sensitivity to the preservative Thimersol? no    Do you have a past history of Guillan-Barre Syndrome? no    Do you currently have an acute febrile illness? no    Have you ever had a severe reaction to latex? no    Vaccine information given and explained to patient? yes    Are you currently pregnant? no    Lot Number:[id number]   Exp Date:04/27/2011   Site Given  Left Deltoid IMbflu

## 2010-11-29 LAB — HM DEXA SCAN: HM Dexa Scan: ABNORMAL

## 2010-12-13 NOTE — Progress Notes (Signed)
Summary: Waiting for form faxed  Phone Note Call from Patient   Caller: Patient Call For: Evelena Peat MD Summary of Call: Pt faxed a form to be filled out for insurance purposes.  If  completed and received by  May 1 she will receive $260.00 off this years insurance premiums.  Pt tried to schedule OV, CPX and was told she could not due to scheduling in new Epic.  Pt faxed the form yesterday and is questioning if she can just get lab work done, fome be completed and she have CPX at a later date. Initial call taken by: Sid Falcon LPN,  November 27, 2010 9:20 AM  Follow-up for Phone Call        She has not had labs in over one year.  My concern if she only gets labs (without CPE) is that insurance will not cover.  My suggestion is this:  get her scheduled in April and I will manage to see her before May deadline. Follow-up by: Evelena Peat MD,  November 27, 2010 2:00 PM  Additional Follow-up for Phone Call Additional follow up Details #1::        Forwarded note to schedulers with paperwork Additional Follow-up by: Sid Falcon LPN,  November 27, 2010 2:09 PM    Additional Follow-up for Phone Call Additional follow up Details #2::    I called pt to sch cpx, but pt said that she would have to call back later to sch. Waiting on call back from pt.  Follow-up by: Lucy Antigua,  November 27, 2010 2:45 PM  Additional Follow-up for Phone Call Additional follow up Details #3:: Details for Additional Follow-up Action Taken: Pt has been sch cpx labs in March and cpx in April.    Additional Follow-up by: Lucy Antigua,  December 04, 2010 10:25 AM

## 2010-12-27 ENCOUNTER — Telehealth: Payer: Self-pay | Admitting: Family Medicine

## 2010-12-27 ENCOUNTER — Ambulatory Visit (INDEPENDENT_AMBULATORY_CARE_PROVIDER_SITE_OTHER)
Admission: RE | Admit: 2010-12-27 | Discharge: 2010-12-27 | Disposition: A | Discharge status: Home / Self Care | Payer: Self-pay | Source: Ambulatory Visit | Attending: Family Medicine | Admitting: Family Medicine

## 2010-12-27 ENCOUNTER — Telehealth: Payer: Self-pay | Admitting: *Deleted

## 2010-12-27 DIAGNOSIS — R9389 Abnormal findings on diagnostic imaging of other specified body structures: Secondary | ICD-10-CM

## 2010-12-27 NOTE — Telephone Encounter (Signed)
Pt needs follow-up plain films left hip and proximal femur, per Dr Caryl Never, please order, order placed, will route to Dr Caryl Never

## 2010-12-27 NOTE — Telephone Encounter (Signed)
Pt called and is req xray results. Pt req to be called back asap today.

## 2010-12-27 NOTE — Telephone Encounter (Signed)
Please advise 

## 2010-12-27 NOTE — Telephone Encounter (Signed)
Pt notified of DEXA results.  She will go for plain film x-rays of hip later today to evaluate L subtrochanteric sclerotic density. No L hip pain.

## 2010-12-28 ENCOUNTER — Ambulatory Visit (INDEPENDENT_AMBULATORY_CARE_PROVIDER_SITE_OTHER): Payer: BC Managed Care – PPO | Admitting: Family Medicine

## 2010-12-28 ENCOUNTER — Telehealth: Payer: Self-pay | Admitting: Family Medicine

## 2010-12-28 ENCOUNTER — Encounter: Payer: Self-pay | Admitting: Family Medicine

## 2010-12-28 DIAGNOSIS — M810 Age-related osteoporosis without current pathological fracture: Secondary | ICD-10-CM

## 2010-12-28 DIAGNOSIS — R9389 Abnormal findings on diagnostic imaging of other specified body structures: Secondary | ICD-10-CM

## 2010-12-28 NOTE — Telephone Encounter (Signed)
Pt requesting a call back today in regards to lab results.

## 2010-12-28 NOTE — Telephone Encounter (Signed)
Pt called and is still wondering why she has not rcvd a call back with her xray results. Pls call back asap today.

## 2010-12-28 NOTE — Telephone Encounter (Signed)
Ok to send DEXA results.

## 2010-12-28 NOTE — Progress Notes (Signed)
  Subjective:    Patient ID: Gabrielle Brock, female    DOB: June 07, 1956, 55 y.o.   MRN: [id number]  HPI  Patient here to discuss recent DEXA scan. Received call from radiologist that she had area of mild sclerosis left intertrochanteric region. Plain x-rays recommended to rule out any stress reaction related to bisphosphonate use. Patient denies any recent hip pains. Reviewed studies with patient today. Plain films of the hip and proximal femur reveal incidental bony island. No stress reaction or evidence for stress fracture. DEXA scan T score -2.7 hip. This is essentially unchanged from prior DEXA scan 4 years ago.  Patient remains on Boniva once monthly. Also doing more weightbearing exercise with walking and calcium and vitamin D supplementation. Nonsmoker. Positive family history of osteoporosis   Review of Systems     Objective:   Physical Exam  alert thin 55 year old female  chest clear to auscultation Heart regular rhythm and rate       Assessment & Plan:   osteoporosis   incidental bony island left hip   Discussed x-rays in detail. Patient given copy of both reports. Continue Boniva along with other measures to reduce bone loss including calcium and vitamin D supplementation and weightbearing exercise.

## 2010-12-28 NOTE — Telephone Encounter (Signed)
Pt scheduled for follow up

## 2010-12-28 NOTE — Telephone Encounter (Signed)
Faxed dexa to pt yesterday, confirmation received

## 2011-01-01 DIAGNOSIS — Z136 Encounter for screening for cardiovascular disorders: Secondary | ICD-10-CM

## 2011-01-08 ENCOUNTER — Encounter (INDEPENDENT_AMBULATORY_CARE_PROVIDER_SITE_OTHER): Payer: Self-pay | Admitting: *Deleted

## 2011-01-08 ENCOUNTER — Other Ambulatory Visit: Payer: Self-pay | Admitting: Family Medicine

## 2011-01-08 ENCOUNTER — Other Ambulatory Visit: Payer: BC Managed Care – PPO

## 2011-01-08 DIAGNOSIS — Z0000 Encounter for general adult medical examination without abnormal findings: Secondary | ICD-10-CM

## 2011-01-08 LAB — CBC WITH DIFFERENTIAL/PLATELET
Basophils Relative: 0.6 % (ref 0.0–3.0)
Eosinophils Relative: 6.4 % — ABNORMAL HIGH (ref 0.0–5.0)
Lymphocytes Relative: 23.2 % (ref 12.0–46.0)
MCV: 92.5 fl (ref 78.0–100.0)
Monocytes Absolute: 0.7 10*3/uL (ref 0.1–1.0)
Monocytes Relative: 6.8 % (ref 3.0–12.0)
Neutrophils Relative %: 63 % (ref 43.0–77.0)
Platelets: 269 10*3/uL (ref 150.0–400.0)
RBC: 3.82 Mil/uL — ABNORMAL LOW (ref 3.87–5.11)
WBC: 10.1 10*3/uL (ref 4.5–10.5)

## 2011-01-08 LAB — URINALYSIS
Hgb urine dipstick: NEGATIVE
Ketones, ur: NEGATIVE
Leukocytes, UA: NEGATIVE
Specific Gravity, Urine: 1.025 (ref 1.000–1.030)
Urine Glucose: NEGATIVE
Urobilinogen, UA: 0.2 (ref 0.0–1.0)

## 2011-01-08 LAB — HEPATIC FUNCTION PANEL
ALT: 18 U/L (ref 0–35)
Alkaline Phosphatase: 44 U/L (ref 39–117)
Bilirubin, Direct: 0.1 mg/dL (ref 0.0–0.3)
Total Bilirubin: 0.5 mg/dL (ref 0.3–1.2)
Total Protein: 6.8 g/dL (ref 6.0–8.3)

## 2011-01-08 LAB — LIPID PANEL
Cholesterol: 185 mg/dL (ref 0–200)
LDL Cholesterol: 112 mg/dL — ABNORMAL HIGH (ref 0–99)
Total CHOL/HDL Ratio: 3
VLDL: 8.4 mg/dL (ref 0.0–40.0)

## 2011-01-08 LAB — BASIC METABOLIC PANEL
BUN: 20 mg/dL (ref 6–23)
Calcium: 8.8 mg/dL (ref 8.4–10.5)
Chloride: 102 mEq/L (ref 96–112)
Creatinine, Ser: 0.7 mg/dL (ref 0.4–1.2)
GFR: 91.57 mL/min (ref >60.00)

## 2011-01-11 ENCOUNTER — Encounter: Payer: Self-pay | Admitting: Family Medicine

## 2011-01-11 ENCOUNTER — Ambulatory Visit (INDEPENDENT_AMBULATORY_CARE_PROVIDER_SITE_OTHER): Payer: BC Managed Care – PPO | Admitting: Family Medicine

## 2011-01-11 DIAGNOSIS — Z0000 Encounter for general adult medical examination without abnormal findings: Secondary | ICD-10-CM

## 2011-01-11 DIAGNOSIS — J309 Allergic rhinitis, unspecified: Secondary | ICD-10-CM

## 2011-01-11 MED ORDER — AZELASTINE HCL 0.1 % NA SOLN: 1.0000 | Freq: Two times a day (BID) | NASAL | Status: DC

## 2011-01-11 NOTE — Progress Notes (Signed)
  Subjective:    Patient ID: Gabrielle Brock, female    DOB: [birth year], 55 y.o.   MRN: [id number]  HPI  Patient here for complete physical. She sees a gynecologist regularly and had normal Pap smear and mammogram last fall. Date of last tetanus unknown. She continues to exercise regularly. Nonsmoker. Takes calcium and vitamin D supplementation.  Past medical history reviewed. History of osteoporosis , hypertension , depression, and hyperlipidemia  No past medical history on file. Past Surgical History  Procedure Date  . Eye surgery     torn retina  . Breast surgery     biopsy    reports that she has never smoked. She does not have any smokeless tobacco history on file. Her alcohol and drug histories not on file. family history includes Alzheimer's disease in her mother; Heart disease in her other; Heart disease (age of onset:74) in her father; Hyperlipidemia in her other; Hypertension in her other; and Parkinsonism in her father. Allergies  Allergen Reactions  . Cefaclor     REACTION: hives      Review of Systems  Constitutional: Positive for appetite change. Negative for fever, activity change and fatigue.  HENT: Negative for hearing loss, ear pain, sore throat and trouble swallowing.   Eyes: Negative for visual disturbance.  Respiratory: Negative for cough and shortness of breath.   Cardiovascular: Negative for chest pain and palpitations.  Gastrointestinal: Negative for abdominal pain, diarrhea, constipation and blood in stool.  Genitourinary: Negative for dysuria and hematuria.  Musculoskeletal: Negative for myalgias, back pain and arthralgias.  Skin: Negative for rash.  Neurological: Negative for dizziness, syncope and headaches.  Hematological: Negative for adenopathy.  Psychiatric/Behavioral: Negative for confusion and dysphoric mood.       Objective:   Physical Exam  Vitals reviewed. Constitutional: She is oriented to person, place, and time. She appears  well-developed and well-nourished. No distress.  HENT:  Head: Normocephalic and atraumatic.  Right Ear: External ear normal.  Left Ear: External ear normal.  Eyes: Conjunctivae and EOM are normal. Pupils are equal, round, and reactive to light.  Neck: Normal range of motion. Neck supple. No thyromegaly present.  Cardiovascular: Normal rate, regular rhythm and normal heart sounds.   No murmur heard. Pulmonary/Chest: Breath sounds normal.  Abdominal: Soft. Bowel sounds are normal. There is no tenderness.  Genitourinary:       PER ob  Musculoskeletal: Normal range of motion. She exhibits no edema and no tenderness.  Lymphadenopathy:    She has no cervical adenopathy.  Neurological: She is alert and oriented to person, place, and time. No cranial nerve deficit. She exhibits normal muscle tone.  Skin: Skin is warm and dry. No rash noted.  Psychiatric: She has a normal mood and affect.          Assessment & Plan:  Annual well care visit.  Labs reviewed.  Discussed exercise, calcium and Vit D.  Needs Tdap and she wishes to schedule as nurse visit at later date. She will continue to see gyn for pap and mammograms.  Recent DEXA reviewed and stable compared with 2008 study.

## 2011-01-11 NOTE — Patient Instructions (Signed)
Continue weight bearing exercise. Schedule Tdap at your convenience.

## 2011-01-12 ENCOUNTER — Encounter: Payer: Self-pay | Admitting: Family Medicine

## 2011-01-29 ENCOUNTER — Encounter: Payer: Self-pay | Admitting: Family Medicine

## 2011-02-05 ENCOUNTER — Encounter: Payer: Self-pay | Admitting: Family Medicine

## 2011-02-26 ENCOUNTER — Other Ambulatory Visit: Payer: Self-pay | Admitting: *Deleted

## 2011-02-26 MED ORDER — OLMESARTAN MEDOXOMIL 20 MG PO TABS: 20.0000 mg | ORAL_TABLET | Freq: Every day | ORAL | Status: DC

## 2011-03-21 ENCOUNTER — Encounter: Payer: Self-pay | Admitting: Family Medicine

## 2011-04-02 ENCOUNTER — Encounter: Payer: Self-pay | Admitting: Family Medicine

## 2011-05-22 ENCOUNTER — Telehealth: Payer: Self-pay | Admitting: Family Medicine

## 2011-05-22 NOTE — Telephone Encounter (Signed)
Pt requesting refill on Nexium 40mg  and Boniva 150mg  requesting 3 refill of each.  Send to [facility]

## 2011-05-23 MED ORDER — IBANDRONATE SODIUM 150 MG PO TABS: 150.0000 mg | ORAL_TABLET | ORAL | Status: DC

## 2011-05-23 MED ORDER — ESOMEPRAZOLE MAGNESIUM 40 MG PO CPDR: 40.0000 mg | DELAYED_RELEASE_CAPSULE | Freq: Every day | ORAL | Status: DC

## 2011-05-23 NOTE — Telephone Encounter (Signed)
[facility] [address]

## 2011-06-20 ENCOUNTER — Other Ambulatory Visit: Payer: Self-pay | Admitting: *Deleted

## 2011-06-20 DIAGNOSIS — M25562 Pain in left knee: Secondary | ICD-10-CM

## 2011-06-24 ENCOUNTER — Ambulatory Visit
Admission: RE | Admit: 2011-06-24 | Discharge: 2011-06-24 | Disposition: A | Discharge status: Home / Self Care | Payer: BC Managed Care – PPO | Source: Ambulatory Visit | Attending: *Deleted | Admitting: *Deleted

## 2011-06-24 DIAGNOSIS — M25562 Pain in left knee: Secondary | ICD-10-CM

## 2011-10-29 HISTORY — PX: DILATION AND CURETTAGE OF UTERUS: SHX78

## 2011-10-31 ENCOUNTER — Telehealth: Payer: Self-pay | Admitting: Family Medicine

## 2011-10-31 NOTE — Telephone Encounter (Signed)
Can't be sure about personality compatibility but try Dr Vincente Poli.

## 2011-10-31 NOTE — Telephone Encounter (Signed)
Pt requesting advise on a good fit for a gynecologist for her. Please contact

## 2011-10-31 NOTE — Telephone Encounter (Signed)
Pt informed

## 2011-10-31 NOTE — Telephone Encounter (Signed)
Please advise 

## 2011-11-21 ENCOUNTER — Telehealth: Payer: Self-pay | Admitting: Family Medicine

## 2011-11-21 ENCOUNTER — Encounter (HOSPITAL_BASED_OUTPATIENT_CLINIC_OR_DEPARTMENT_OTHER): Payer: Self-pay | Admitting: *Deleted

## 2011-11-21 NOTE — Telephone Encounter (Signed)
Having a D&C on next Wednesday. FYI. She will be faxing over papers form her insurance company regarding Nexium and Benicar, needs step therapy. Thanks.

## 2011-11-21 NOTE — H&P (Signed)
Gabrielle Brock  DICTATION # [id number] CSN# [id number]   Meriel Pica, MD 11/21/2011 12:42 PM

## 2011-11-21 NOTE — Progress Notes (Signed)
NPO AFTER MN. ARRIVES AT 0715. NEEDS ISTAT. CURRENT EKG IN EPIC, 01-11-2011.

## 2011-11-22 NOTE — H&P (Signed)
Gabrielle Brock, Gabrielle Brock              ACCOUNT NO.:  [id number]  MEDICAL RECORD NO.:  [id number]  LOCATION:                               FACILITY:  [facility]  PHYSICIAN:  Duke Salvia. Marcelle Overlie, M.D.DATE OF BIRTH:  1955/12/06  DATE OF ADMISSION:  11/27/2011 DATE OF DISCHARGE:                             HISTORY & PHYSICAL   CHIEF COMPLAINT:  Abnormal uterine bleeding, endometrial polyp.  HISTORY OF PRESENT ILLNESS:  A 56 year old, G0, P0, postmenopausal patient who regularly sees Dr. Chevis Pretty, presented to him to evaluate some postmenopausal bleeding.  He scheduled saline infusion ultrasound and showed a well-defined polyp, no other abnormalities noted.  She presents now for D and C, hysteroscopy.  This procedure including risks related to bleeding, infection, other complications may require additional surgery such as uterine perforation discussed with her which she understands and accepts.  PAST MEDICAL HISTORY:  Allergy to CECLOR.  CURRENT MEDICATIONS:  Nexium, Ludiomil, Benicar, Boniva, and multivitamins.  SURGICAL HISTORY:  Right breast biopsy in 2006 for benign disease.  REVIEW OF SYSTEMS:  Significant for a history of headache, hypertension, and arthritis.  FAMILY HISTORY:  Otherwise negative.  SOCIAL HISTORY:  Denies tobacco or drug use.  Does consume less than 1 alcoholic drink per week.  She is single.  Dr. Caryl Never is her medical doctor.  PHYSICAL EXAMINATION:  VITAL SIGNS:  Temperature 98.2, blood pressure 120/78.  HEENT:  Unremarkable. NECK:  Supple without masses. LUNGS:  Clear. CARDIOVASCULAR:  Regular rate and rhythm without murmurs, rubs, gallops. BREASTS:  Without masses. ABDOMEN:  Soft, flat, nontender. PELVIC:  Normal external genitalia.  Vagina and cervix clear.  Uterus, mid position, normal size.  Adnexa negative. EXTREMITIES:  Unremarkable. NEUROLOGIC:  Unremarkable.  IMPRESSION:  Postmenopausal bleeding, endometrial polyp noted on saline infusion  ultrasound.  PLAN:  D and C, hysteroscopy.  Procedure and risks reviewed as above.     Melania Kirks M. Marcelle Overlie, M.D.     [facility]  D:  11/21/2011  T:  11/21/2011  Job:  [id number]

## 2011-11-25 NOTE — Telephone Encounter (Signed)
Paperwork came today, on your desk

## 2011-11-27 ENCOUNTER — Encounter (HOSPITAL_BASED_OUTPATIENT_CLINIC_OR_DEPARTMENT_OTHER): Payer: Self-pay | Admitting: *Deleted

## 2011-11-27 ENCOUNTER — Encounter (HOSPITAL_BASED_OUTPATIENT_CLINIC_OR_DEPARTMENT_OTHER): Payer: Self-pay | Admitting: Anesthesiology

## 2011-11-27 ENCOUNTER — Encounter (HOSPITAL_BASED_OUTPATIENT_CLINIC_OR_DEPARTMENT_OTHER)
Admission: RE | Disposition: A | Discharge status: Home / Self Care | Payer: Self-pay | Source: Ambulatory Visit | Attending: Obstetrics and Gynecology

## 2011-11-27 ENCOUNTER — Other Ambulatory Visit: Payer: Self-pay | Admitting: Obstetrics and Gynecology

## 2011-11-27 ENCOUNTER — Ambulatory Visit (HOSPITAL_BASED_OUTPATIENT_CLINIC_OR_DEPARTMENT_OTHER)
Admission: RE | Admit: 2011-11-27 | Discharge: 2011-11-27 | Disposition: A | Discharge status: Home / Self Care | Payer: BC Managed Care – PPO | Source: Ambulatory Visit | Attending: Obstetrics and Gynecology | Admitting: Obstetrics and Gynecology

## 2011-11-27 ENCOUNTER — Ambulatory Visit (HOSPITAL_BASED_OUTPATIENT_CLINIC_OR_DEPARTMENT_OTHER): Payer: BC Managed Care – PPO | Admitting: Anesthesiology

## 2011-11-27 DIAGNOSIS — Z79899 Other long term (current) drug therapy: Secondary | ICD-10-CM | POA: Insufficient documentation

## 2011-11-27 DIAGNOSIS — N950 Postmenopausal bleeding: Secondary | ICD-10-CM | POA: Insufficient documentation

## 2011-11-27 DIAGNOSIS — N840 Polyp of corpus uteri: Secondary | ICD-10-CM | POA: Insufficient documentation

## 2011-11-27 DIAGNOSIS — M129 Arthropathy, unspecified: Secondary | ICD-10-CM | POA: Insufficient documentation

## 2011-11-27 DIAGNOSIS — I10 Essential (primary) hypertension: Secondary | ICD-10-CM | POA: Insufficient documentation

## 2011-11-27 HISTORY — DX: Hyperlipidemia, unspecified: E78.5

## 2011-11-27 HISTORY — DX: Other seasonal allergic rhinitis: J30.2

## 2011-11-27 HISTORY — DX: Unspecified osteoarthritis, unspecified site: M19.90

## 2011-11-27 HISTORY — DX: Nonrheumatic mitral (valve) prolapse: I34.1

## 2011-11-27 HISTORY — DX: Gastro-esophageal reflux disease without esophagitis: K21.9

## 2011-11-27 HISTORY — PX: HYSTEROSCOPY WITH D & C: SHX1775

## 2011-11-27 HISTORY — DX: Essential (primary) hypertension: I10

## 2011-11-27 HISTORY — DX: Major depressive disorder, single episode, unspecified: F32.9

## 2011-11-27 HISTORY — DX: Personal history of traumatic brain injury: Z87.820

## 2011-11-27 HISTORY — DX: Depression, unspecified: F32.A

## 2011-11-27 LAB — POCT PREGNANCY, URINE: Preg Test, Ur: NEGATIVE

## 2011-11-27 LAB — POCT I-STAT 4, (NA,K, GLUC, HGB,HCT): Sodium: 141 mEq/L (ref 135–145)

## 2011-11-27 SURGERY — DILATATION AND CURETTAGE /HYSTEROSCOPY
Anesthesia: General | Site: Vagina | Wound class: Clean Contaminated

## 2011-11-27 MED ORDER — ESOMEPRAZOLE MAGNESIUM 40 MG PO CPDR: 40.0000 mg | DELAYED_RELEASE_CAPSULE | Freq: Every evening | ORAL | Status: DC

## 2011-11-27 MED ORDER — PROMETHAZINE HCL 25 MG/ML IJ SOLN: 6.2500 mg | INTRAMUSCULAR | Status: DC | PRN

## 2011-11-27 MED ORDER — PANTOPRAZOLE SODIUM 40 MG PO TBEC: 40.0000 mg | DELAYED_RELEASE_TABLET | Freq: Every day | ORAL | Status: DC

## 2011-11-27 MED ORDER — CEFAZOLIN SODIUM 1-5 GM-% IV SOLN: 1.0000 g | INTRAVENOUS | Status: DC

## 2011-11-27 MED ORDER — GLYCINE 1.5 % IR SOLN
Status: DC | PRN
  Administered 2011-11-27: 3000 mL

## 2011-11-27 MED ORDER — LACTATED RINGERS IV SOLN: INTRAVENOUS | Status: DC

## 2011-11-27 MED ORDER — MIDAZOLAM HCL 5 MG/5ML IJ SOLN
INTRAMUSCULAR | Status: DC | PRN
  Administered 2011-11-27: 1 mg via INTRAVENOUS

## 2011-11-27 MED ORDER — KETOROLAC TROMETHAMINE 30 MG/ML IJ SOLN
INTRAMUSCULAR | Status: DC | PRN
  Administered 2011-11-27: 30 mg via INTRAVENOUS

## 2011-11-27 MED ORDER — LIDOCAINE HCL 1 % IJ SOLN
INTRAMUSCULAR | Status: DC | PRN
  Administered 2011-11-27: 6 mL

## 2011-11-27 MED ORDER — DEXAMETHASONE SODIUM PHOSPHATE 4 MG/ML IJ SOLN
INTRAMUSCULAR | Status: DC | PRN
  Administered 2011-11-27: 10 mg via INTRAVENOUS

## 2011-11-27 MED ORDER — FENTANYL CITRATE 0.05 MG/ML IJ SOLN
INTRAMUSCULAR | Status: DC | PRN
  Administered 2011-11-27: 50 ug via INTRAVENOUS
  Administered 2011-11-27 (×2): 25 ug via INTRAVENOUS

## 2011-11-27 MED ORDER — FENTANYL CITRATE 0.05 MG/ML IJ SOLN
25.0000 ug | INTRAMUSCULAR | Status: DC | PRN
  Administered 2011-11-27: 25 ug via INTRAVENOUS

## 2011-11-27 MED ORDER — PROPOFOL 10 MG/ML IV EMUL
INTRAVENOUS | Status: DC | PRN
  Administered 2011-11-27: 200 mg via INTRAVENOUS

## 2011-11-27 MED ORDER — LACTATED RINGERS IV SOLN
INTRAVENOUS | Status: DC
  Administered 2011-11-27: 100 mL/h via INTRAVENOUS
  Administered 2011-11-27: 08:00:00 via INTRAVENOUS

## 2011-11-27 MED ORDER — RAMIPRIL 5 MG PO CAPS: 5.0000 mg | ORAL_CAPSULE | Freq: Every day | ORAL | Status: DC

## 2011-11-27 MED ORDER — KETOROLAC TROMETHAMINE 30 MG/ML IJ SOLN: 15.0000 mg | Freq: Once | INTRAMUSCULAR | Status: DC | PRN

## 2011-11-27 MED ORDER — LIDOCAINE HCL (CARDIAC) 20 MG/ML IV SOLN
INTRAVENOUS | Status: DC | PRN
  Administered 2011-11-27: 60 mg via INTRAVENOUS

## 2011-11-27 MED ORDER — ONDANSETRON HCL 4 MG/2ML IJ SOLN
INTRAMUSCULAR | Status: DC | PRN
  Administered 2011-11-27: 4 mg via INTRAVENOUS

## 2011-11-27 SURGICAL SUPPLY — 33 items
CANISTER SUCTION 2500CC (MISCELLANEOUS) ×2 IMPLANT
CATH ROBINSON RED A/P 16FR (CATHETERS) ×2 IMPLANT
CLOTH BEACON ORANGE TIMEOUT ST (SAFETY) ×2 IMPLANT
CORD ACTIVE DISPOSABLE (ELECTRODE)
CORD ELECTRO ACTIVE DISP (ELECTRODE) IMPLANT
COVER TABLE BACK 60X90 (DRAPES) ×2 IMPLANT
DRAPE CAMERA CLOSED 9X96 (DRAPES) ×2 IMPLANT
DRAPE LG THREE QUARTER DISP (DRAPES) ×2 IMPLANT
DRESSING TELFA 8X3 (GAUZE/BANDAGES/DRESSINGS) ×2 IMPLANT
ELECT LOOP GYNE PRO 24FR (CUTTING LOOP)
ELECT REM PT RETURN 9FT ADLT (ELECTROSURGICAL) ×2
ELECT VAPORTRODE GRVD BAR (ELECTRODE) IMPLANT
ELECTRODE LOOP GYNE PRO 24FR (CUTTING LOOP) IMPLANT
ELECTRODE REM PT RTRN 9FT ADLT (ELECTROSURGICAL) ×1 IMPLANT
ELECTRODE ROLLER BARREL 22FR (ELECTROSURGICAL) IMPLANT
ELECTRODE VAPORCUT 22FR (ELECTROSURGICAL) IMPLANT
GLOVE BIO SURGEON STRL SZ7 (GLOVE) ×4 IMPLANT
GLOVE BIOGEL PI IND STRL 6.5 (GLOVE) ×1 IMPLANT
GLOVE BIOGEL PI INDICATOR 6.5 (GLOVE) ×1
GLOVE ECLIPSE 6.5 STRL STRAW (GLOVE) ×2 IMPLANT
GLYCINE 1.5% IRRIG UROMATIC (IV SOLUTION) ×2 IMPLANT
GOWN PREVENTION PLUS LG XLONG (DISPOSABLE) ×2 IMPLANT
GOWN STRL REIN XL XLG (GOWN DISPOSABLE) ×2 IMPLANT
LEGGING LITHOTOMY PAIR STRL (DRAPES) ×2 IMPLANT
LOOP ANGLED CUTTING 22FR (CUTTING LOOP) IMPLANT
NEEDLE SPNL 22GX7 QUINCKE BK (NEEDLE) ×2 IMPLANT
PACK BASIN DAY SURGERY FS (CUSTOM PROCEDURE TRAY) ×2 IMPLANT
PAD OB MATERNITY 4.3X12.25 (PERSONAL CARE ITEMS) ×2 IMPLANT
PAD PREP 24X48 CUFFED NSTRL (MISCELLANEOUS) ×2 IMPLANT
SYR CONTROL 10ML LL (SYRINGE) ×2 IMPLANT
TOWEL OR 17X24 6PK STRL BLUE (TOWEL DISPOSABLE) ×4 IMPLANT
TUBING HYDROFLEX HYSTEROSCOPY (TUBING) ×2 IMPLANT
WATER STERILE IRR 500ML POUR (IV SOLUTION) IMPLANT

## 2011-11-27 NOTE — Anesthesia Preprocedure Evaluation (Signed)
Anesthesia Evaluation  Patient identified by MRN, date of birth, ID band Patient awake    Reviewed: Allergy & Precautions, H&P , NPO status , Patient's Chart, lab work & pertinent test results  Airway Mallampati: II TM Distance: >3 FB Neck ROM: Full    Dental No notable dental hx.    Pulmonary neg pulmonary ROS,  clear to auscultation  Pulmonary exam normal       Cardiovascular hypertension, Regular Normal    Neuro/Psych Negative Neurological ROS  Negative Psych ROS   GI/Hepatic Neg liver ROS, GERD-  ,  Endo/Other  Negative Endocrine ROS  Renal/GU negative Renal ROS  Genitourinary negative   Musculoskeletal negative musculoskeletal ROS (+)   Abdominal   Peds negative pediatric ROS (+)  Hematology negative hematology ROS (+)   Anesthesia Other Findings   Reproductive/Obstetrics negative OB ROS                           Anesthesia Physical Anesthesia Plan  ASA: II  Anesthesia Plan: General   Post-op Pain Management:    Induction: Intravenous  Airway Management Planned: LMA  Additional Equipment:   Intra-op Plan:   Post-operative Plan:   Informed Consent:   Dental advisory given  Plan Discussed with: CRNA  Anesthesia Plan Comments:         Anesthesia Quick Evaluation

## 2011-11-27 NOTE — Progress Notes (Signed)
The patient was re-examined with no change in status 

## 2011-11-27 NOTE — Transfer of Care (Signed)
Immediate Anesthesia Transfer of Care Note  Patient: Gabrielle Brock  Procedure(s) Performed:  DILATATION AND CURETTAGE /HYSTEROSCOPY  Patient Location: PACU  Anesthesia Type: General  Level of Consciousness: drowsy and arousable.  Airway & Oxygen Therapy: Patient Spontanous Breathing and Patient connected to face mask oxygen, oral airway removed upon arrival to PACU  Post-op Assessment: Report given to PACU RN and Post -op Vital signs reviewed and stable  Post vital signs: Reviewed and stable  Complications: No apparent anesthesia complications

## 2011-11-27 NOTE — Telephone Encounter (Signed)
Informed pt that Benicar 20 had been changed to Ramipril 5 and Nexium 40 to pantoprazole 40 by Dr Caryl Never, sent new meds to [facility].  Pt states she did not want to change the Nexium as it works well, states she has tried pepsid and prevacid in the past and they did not work.  Pt wanted me to cancel the order to [facility] as she wants to try Ramipril for 30 days, send to her local pharmacy.  I will also re-order nexium and pharmacy will probably send [place] a prior auth.  Per pt request, send a new order for nexium  #90 with 3 refills and lets see what they do, probably send a PA.  Pt aware

## 2011-11-27 NOTE — Brief Op Note (Signed)
11/27/2011  8:50 AM  PATIENT:  Valla Leaver  56 y.o. female  PRE-OPERATIVE DIAGNOSIS:  endometrial polyp  POST-OPERATIVE DIAGNOSIS:  endometrial polyp  PROCEDURE:  Procedure(s): DILATATION AND CURETTAGE /HYSTEROSCOPY  SURGEON:  Surgeon(s): Meriel Pica, MD  PHYSICIAN ASSISTANT:   ASSISTANTS: none   ANESTHESIA:   general  EBL:  Total I/O In: 100 [I.V.:100] Out: -   BLOOD ADMINISTERED:none  DRAINS: Urinary Catheter (Foley)   LOCAL MEDICATIONS USED:  XYLOCAINE 10CC  SPECIMEN:  Source of Specimen:  endometrial curettings  DISPOSITION OF SPECIMEN:  PATHOLOGY  COUNTS:  YES  TOURNIQUET:  * No tourniquets in log *  DICTATION: .Dragon Dictation  PLAN OF CARE: Discharge to home after PACU  PATIENT DISPOSITION:  PACU - hemodynamically stable.   Delay start of Pharmacological VTE agent (>24hrs) due to surgical blood loss or risk of bleeding:  {YES/NO/NOT APPLICABLE:20182  Preoperative diagnosis: Abnormal uterine bleeding, endometrial polyp  Postoperative diagnosis: Same  Procedure: D&C hysteroscopy  Surgeon: Marcelle Overlie  Assistant: None  Specimens removed: Endometrial curettings  EBL: Less than 10 cc  Procedure and findings:  Patient taken the operating room after an adequate level of general anesthesia was obtained with the legs in stirrups the perineum and vagina were prepped and draped in the usual fashion for D&C, hysteroscopy. Bladder was drained he UA carried out uterus was small mobile mid to posterior in position. Speculum was positioned paracervical block was then created by infiltrating at 3 and 9:00 submucosally, 7 cc 1% Xylocaine at each site after negative aspiration. The uterus was then sounded to 8 cm. Progressively dilated to a 27 Pratt dilator, the continuous flow hysteroscope was then used to explore the cavity revealing some minimal endometrial buildup at the fundus there was no evidence of well-defined polyp however. Sharp curettage was then  carried out minimal tissue specimen sent to pathology as endometrial curettings. The scope was reinserted the cavity was irrigated noted to be clean this was well-tolerated she went to PACU in good condition Dictated with [employer], Duke Salvia. Milana Obey.D.

## 2011-11-27 NOTE — Anesthesia Procedure Notes (Signed)
Procedure Name: LMA Insertion Date/Time: 11/27/2011 8:34 AM Performed by: Huel Coventry Pre-anesthesia Checklist: Patient identified, Emergency Drugs available, Suction available and Patient being monitored Patient Re-evaluated:Patient Re-evaluated prior to inductionOxygen Delivery Method: Circle System Utilized Preoxygenation: Pre-oxygenation with 100% oxygen Intubation Type: IV induction Ventilation: Mask ventilation without difficulty LMA: LMA inserted LMA Size: 3.0 Number of attempts: 1 Airway Equipment and Method: bite block Placement Confirmation: positive ETCO2 Tube secured with: Tape Dental Injury: Teeth and Oropharynx as per pre-operative assessment

## 2011-11-27 NOTE — Anesthesia Postprocedure Evaluation (Signed)
  Anesthesia Post-op Note  Patient: Gabrielle Brock  Procedure(s) Performed:  DILATATION AND CURETTAGE /HYSTEROSCOPY  Patient Location: PACU  Anesthesia Type: General  Level of Consciousness: awake and alert   Airway and Oxygen Therapy: Patient Spontanous Breathing  Post-op Pain: mild  Post-op Assessment: Post-op Vital signs reviewed, Patient's Cardiovascular Status Stable, Respiratory Function Stable, Patent Airway and No signs of Nausea or vomiting  Post-op Vital Signs: stable  Complications: No apparent anesthesia complications

## 2011-11-27 NOTE — Progress Notes (Signed)
Patient stated that she would only have someone with her during the day and not overnight. Discussed with Dr. Okey Dupre and he stated that it would be fine to send her home.

## 2011-11-27 NOTE — Progress Notes (Addendum)
Pt states dr. Marcelle Overlie was going to leave her something for pain.  No script on chart.  Dr. Marcelle Overlie called and informed.  Order received for percocet 5/325 x1 prn.  Pt instructed to go by office at d/dc and pick up script for percocet. Pt informed  toradol given at 0845.  No advil aleve, nsaids should be taken until 2pm today. Pt verbalized her understanding.

## 2011-11-28 ENCOUNTER — Encounter (HOSPITAL_BASED_OUTPATIENT_CLINIC_OR_DEPARTMENT_OTHER): Payer: Self-pay | Admitting: Obstetrics and Gynecology

## 2011-12-05 ENCOUNTER — Telehealth: Payer: Self-pay | Admitting: Family Medicine

## 2011-12-05 DIAGNOSIS — Z0000 Encounter for general adult medical examination without abnormal findings: Secondary | ICD-10-CM

## 2011-12-05 NOTE — Telephone Encounter (Signed)
Pt is requesting cpx this month (FEB) Pt is aware her last cpx was march 2012. Pt stated she called her ins company and her cpx is one per calendar yr. Can I create 30 min slot?

## 2011-12-06 NOTE — Telephone Encounter (Signed)
Order added.

## 2011-12-06 NOTE — Telephone Encounter (Signed)
Pt requesting PTT be added to her [facility] labs, concern for blood clotting

## 2011-12-06 NOTE — Telephone Encounter (Signed)
Pt is sch for cpx on 12-17-2011. Pt would like to go to [facility] for cpx labs, nancy please put order in system. Pt is requesting to go to [facility] on 12-10-2011. Harriett Sine please call pt at [phone] once lab order is in system order thanks

## 2011-12-06 NOTE — Telephone Encounter (Signed)
Addended by: Kern Reap B on: 12/06/2011 03:08 PM   Modules accepted: Orders

## 2011-12-06 NOTE — Telephone Encounter (Signed)
OK 

## 2011-12-06 NOTE — Telephone Encounter (Signed)
OK to add

## 2011-12-09 ENCOUNTER — Telehealth: Payer: Self-pay | Admitting: Family Medicine

## 2011-12-09 NOTE — Telephone Encounter (Signed)
Patient called stating she wanted to leave a vm for the nurse. Patient was very upset that she could not leave a vm. Patient states that [facility] has sent over a med therapy request and it has gone undone. Patient would like a call back and to have the rx form completed.

## 2011-12-09 NOTE — Telephone Encounter (Signed)
Gabrielle Brock has a PA for the nexium and will fill out today and fax to [employer].  Pt informed

## 2011-12-10 ENCOUNTER — Ambulatory Visit: Payer: BC Managed Care – PPO

## 2011-12-10 DIAGNOSIS — Z0000 Encounter for general adult medical examination without abnormal findings: Secondary | ICD-10-CM

## 2011-12-10 LAB — URINALYSIS
Ketones, ur: NEGATIVE
Leukocytes, UA: NEGATIVE
Nitrite: NEGATIVE
Specific Gravity, Urine: 1.015 (ref 1.000–1.030)
Total Protein, Urine: NEGATIVE
pH: 6 (ref 5.0–8.0)

## 2011-12-10 LAB — CBC
HCT: 36.6 % (ref 36.0–46.0)
Hemoglobin: 12.2 g/dL (ref 12.0–15.0)
MCHC: 33.3 g/dL (ref 30.0–36.0)
MCV: 91.8 fl (ref 78.0–100.0)
Platelets: 260.4 10*3/uL (ref 150.0–400.0)
RBC: 3.91 Mil/uL (ref 3.87–5.11)

## 2011-12-10 LAB — HEPATIC FUNCTION PANEL
ALT: 19 U/L (ref 0–35)
Albumin: 4.1 g/dL (ref 3.5–5.2)
Bilirubin, Direct: 0 mg/dL (ref 0.0–0.3)
Total Protein: 7.1 g/dL (ref 6.0–8.3)

## 2011-12-10 LAB — LIPID PANEL
Cholesterol: 180 mg/dL (ref 0–200)
HDL: 63.4 mg/dL (ref >39.00)
Total CHOL/HDL Ratio: 3
Triglycerides: 50 mg/dL (ref 0.0–149.0)

## 2011-12-10 LAB — BASIC METABOLIC PANEL
CO2: 28 mEq/L (ref 19–32)
Chloride: 105 mEq/L (ref 96–112)
Creatinine, Ser: 0.7 mg/dL (ref 0.4–1.2)
Glucose, Bld: 85 mg/dL (ref 70–99)

## 2011-12-10 LAB — APTT: aPTT: 28.5 s (ref 21.7–28.8)

## 2011-12-17 ENCOUNTER — Ambulatory Visit (INDEPENDENT_AMBULATORY_CARE_PROVIDER_SITE_OTHER): Payer: BC Managed Care – PPO | Admitting: Family Medicine

## 2011-12-17 ENCOUNTER — Encounter: Payer: Self-pay | Admitting: Family Medicine

## 2011-12-17 VITALS — BP 140/82 | HR 80 | Temp 97.8°F | Resp 12 | Ht 62.5 in | Wt 112.0 lb

## 2011-12-17 DIAGNOSIS — Z79899 Other long term (current) drug therapy: Secondary | ICD-10-CM

## 2011-12-17 DIAGNOSIS — Z0000 Encounter for general adult medical examination without abnormal findings: Secondary | ICD-10-CM

## 2011-12-17 DIAGNOSIS — M810 Age-related osteoporosis without current pathological fracture: Secondary | ICD-10-CM

## 2011-12-17 DIAGNOSIS — E785 Hyperlipidemia, unspecified: Secondary | ICD-10-CM

## 2011-12-17 DIAGNOSIS — I10 Essential (primary) hypertension: Secondary | ICD-10-CM

## 2011-12-17 NOTE — Patient Instructions (Signed)
Continue to monitor blood pressure and start back medication if > 140/90 Consider Tdap later this year. Continue with calcium and Vit D supplement

## 2011-12-17 NOTE — Progress Notes (Signed)
Subjective:    Patient ID: Gabrielle Brock, female    DOB: 03/12/1956, 56 y.o.   MRN: [id number]  HPI  Visit for complete physical. She actually continues to see gynecologist up in [place]. She recently had some heavy menstrual flow and underwent dilatation and curettage and bleeding eventually subsided. Health maintenance issues reviewed. She continues to exercise with walking and swimming. She has had history of osteoporosis. Last tetanus is greater than 10 years ago. She had a flu vaccine earlier this year. DEXA scan April 2011. Coloscopy possibly 9 years ago. Last mammogram was 2011. She is to schedule followup.  Breast exams/pap -getting through gynecologist.  Hypertension treated with Benicar. She's been off medication for one week. Blood pressure stable around 120 systolic and 70s to 80s diastolic over the past week off medication. She is reluctant to start back. She denied any orthostasis with taking medication.  Patient needs EKG and she takes Ludiomil and needs monitoring for QT prolongation. No dizziness, syncope, or palpitations.  Depression stable.  Past Medical History  Diagnosis Date  . Hypertension   . Depression   . Osteoporosis   . Hyperlipidemia   . Seasonal allergies   . MVP (mitral valve prolapse) ASYMPTOMATIC  . GERD (gastroesophageal reflux disease)   . History of concussion 20 YRS AGO  . Arthritis    Past Surgical History  Procedure Date  . Breast biopsy 2006    RIGHT BREAST -  BENIGN  . Strabismus surgery AGE 74  . Laser retinal repair left eye 20 YRS AGO  . Hysteroscopy w/d&c 11/27/2011    Procedure: DILATATION AND CURETTAGE /HYSTEROSCOPY;  Surgeon: Meriel Pica, MD;  Location: [facility];  Service: Gynecology;  Laterality: N/A;    reports that she has never smoked. She has never used smokeless tobacco. She reports that she drinks alcohol. She reports that she does not use illicit drugs. family history includes Alzheimer's disease in her  mother; Heart disease in her other; Heart disease (age of onset:74) in her father; Hyperlipidemia in her other; Hypertension in her other; and Parkinsonism in her father. Allergies  Allergen Reactions  . Cefaclor Hives      Review of Systems  Constitutional: Negative for fever, activity change, appetite change, fatigue and unexpected weight change.  HENT: Negative for hearing loss, ear pain, sore throat and trouble swallowing.   Eyes: Negative for visual disturbance.  Respiratory: Negative for cough and shortness of breath.   Cardiovascular: Negative for chest pain and palpitations.  Gastrointestinal: Negative for abdominal pain, diarrhea, constipation and blood in stool.  Genitourinary: Negative for dysuria and hematuria.  Musculoskeletal: Negative for myalgias, back pain and arthralgias.  Skin: Negative for rash.  Neurological: Negative for dizziness, syncope and headaches.  Hematological: Negative for adenopathy.  Psychiatric/Behavioral: Negative for confusion and dysphoric mood.       Objective:   Physical Exam  Constitutional: She is oriented to person, place, and time. She appears well-developed and well-nourished.  HENT:  Head: Normocephalic and atraumatic.  Eyes: EOM are normal. Pupils are equal, round, and reactive to light.  Neck: Normal range of motion. Neck supple. No thyromegaly present.  Cardiovascular: Normal rate, regular rhythm and normal heart sounds.   No murmur heard. Pulmonary/Chest: Breath sounds normal. No respiratory distress. She has no wheezes. She has no rales.  Abdominal: Soft. Bowel sounds are normal. She exhibits no distension and no mass. There is no tenderness. There is no rebound and no guarding.  Genitourinary:  Per gyn  Musculoskeletal: Normal range of motion. She exhibits no edema.  Lymphadenopathy:    She has no cervical adenopathy.  Neurological: She is alert and oriented to person, place, and time. She displays normal reflexes. No  cranial nerve deficit.  Skin: No rash noted.  Psychiatric: She has a normal mood and affect. Her behavior is normal. Judgment and thought content normal.          Assessment & Plan:  Health maintenance. Labs reviewed with patient. Basically all normal. She has slightly elevated blood pressure reading here today but improved by home readings. Needs repeat colonoscopy next year. Continue yearly mammogram. Repeat DEXA scan next year. Continue weightbearing exercise. Continue calcium and vitamin D supplementation

## 2012-01-13 ENCOUNTER — Ambulatory Visit (INDEPENDENT_AMBULATORY_CARE_PROVIDER_SITE_OTHER): Payer: BC Managed Care – PPO | Admitting: Family Medicine

## 2012-01-13 ENCOUNTER — Encounter: Payer: Self-pay | Admitting: Family Medicine

## 2012-01-13 VITALS — BP 150/90 | Temp 98.2°F | Wt 113.0 lb

## 2012-01-13 DIAGNOSIS — I10 Essential (primary) hypertension: Secondary | ICD-10-CM

## 2012-01-13 DIAGNOSIS — R140 Abdominal distension (gaseous): Secondary | ICD-10-CM

## 2012-01-13 DIAGNOSIS — M545 Low back pain, unspecified: Secondary | ICD-10-CM

## 2012-01-13 DIAGNOSIS — R141 Gas pain: Secondary | ICD-10-CM

## 2012-01-13 DIAGNOSIS — R635 Abnormal weight gain: Secondary | ICD-10-CM
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
## 2012-01-13 NOTE — Patient Instructions (Signed)
Consider decreasing Benicar to one half tablet daily

## 2012-01-28 ENCOUNTER — Encounter: Payer: Self-pay | Admitting: Family

## 2012-01-28 ENCOUNTER — Ambulatory Visit (INDEPENDENT_AMBULATORY_CARE_PROVIDER_SITE_OTHER): Payer: BC Managed Care – PPO | Admitting: Family

## 2012-01-28 VITALS — BP 120/80 | Temp 98.5°F | Wt 110.0 lb

## 2012-01-28 DIAGNOSIS — M79605 Pain in left leg: Secondary | ICD-10-CM

## 2012-01-28 DIAGNOSIS — M79609 Pain in unspecified limb: Secondary | ICD-10-CM

## 2012-01-28 DIAGNOSIS — S8010XA Contusion of unspecified lower leg, initial encounter: Secondary | ICD-10-CM

## 2012-01-28 DIAGNOSIS — S8012XA Contusion of left lower leg, initial encounter: Secondary | ICD-10-CM
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
## 2012-01-28 NOTE — Patient Instructions (Signed)

## 2012-02-06 ENCOUNTER — Other Ambulatory Visit: Payer: Self-pay | Admitting: Family Medicine

## 2012-02-19 ENCOUNTER — Ambulatory Visit (INDEPENDENT_AMBULATORY_CARE_PROVIDER_SITE_OTHER): Payer: BC Managed Care – PPO | Admitting: Family Medicine

## 2012-02-19 ENCOUNTER — Encounter: Payer: Self-pay | Admitting: Family Medicine

## 2012-02-19 VITALS — BP 130/72 | Temp 98.6°F | Wt 106.0 lb

## 2012-02-19 DIAGNOSIS — J069 Acute upper respiratory infection, unspecified: Secondary | ICD-10-CM

## 2012-02-19 MED ORDER — AZITHROMYCIN 500 MG PO TABS: 500.0000 mg | ORAL_TABLET | Freq: Every day | ORAL | Status: DC

## 2012-02-19 MED ORDER — AZITHROMYCIN 500 MG PO TABS: 500.0000 mg | ORAL_TABLET | Freq: Every day | ORAL | Status: AC

## 2012-02-19 NOTE — Progress Notes (Signed)
  Subjective:    Patient ID: Gabrielle Brock, female    DOB: Oct 21, 1956, 56 y.o.   MRN: [id number]  HPI  URI symptoms. Onset last Thursday of sore throat. By Sunday developed rhinorrhea and a Monday cough productive of yellow sputum. No wheezing. No fever or chills. Sore throat symptoms are improved. She denies any nausea, vomiting, or diarrhea. Nonsmoker.  Patient concerned because of prior history of severe sinusitis the past with similar symptoms. She feels somewhat better today compared with 2 days ago.   Review of Systems  Constitutional: Negative for fever and chills.  HENT: Positive for congestion and sinus pressure. Negative for ear pain, neck pain and neck stiffness.   Respiratory: Positive for cough. Negative for shortness of breath and wheezing.   Neurological: Negative for headaches.       Objective:   Physical Exam  Constitutional: She appears well-developed and well-nourished.  HENT:  Right Ear: External ear normal.  Left Ear: External ear normal.  Mouth/Throat: Oropharynx is clear and moist.  Neck: Neck supple.  Cardiovascular: Normal rate and regular rhythm.   Pulmonary/Chest: Effort normal and breath sounds normal. No respiratory distress. She has no wheezes. She has no rales.  Musculoskeletal: She exhibits no edema.  Lymphadenopathy:    She has no cervical adenopathy.          Assessment & Plan:  URI. Suspect viral. No antibiotic recommended this time. She will only fill prescription for Zithromax if she develops any aggressive facial pain or new symptoms such as fever.

## 2012-02-25 ENCOUNTER — Telehealth: Payer: Self-pay | Admitting: Family Medicine

## 2012-02-25 NOTE — Telephone Encounter (Signed)
Pt was in last week with a virus and is still coughing up mucus and is requesting a call back today

## 2012-02-26 NOTE — Telephone Encounter (Signed)
Would give this time as long as no fever.  Residual cough frequently lasts for several days.

## 2012-02-26 NOTE — Telephone Encounter (Signed)
Pt reporting symptoms she had at recent OV have improved, she has not taken the antibiotic.  She began coughing on the 22nd and it is a lingering, nagging cough.  She is taking plain Mucinex. She prefers not to take the Z-pach, but will if Dr Caryl Never thinks it will help the cough

## 2012-02-26 NOTE — Telephone Encounter (Signed)
Pt informed on VM

## 2012-03-02 ENCOUNTER — Other Ambulatory Visit: Payer: Self-pay | Admitting: Gynecology

## 2012-03-02 DIAGNOSIS — Z1231 Encounter for screening mammogram for malignant neoplasm of breast: Secondary | ICD-10-CM

## 2012-03-11 ENCOUNTER — Other Ambulatory Visit: Payer: Self-pay | Admitting: Family Medicine

## 2012-03-12 ENCOUNTER — Telehealth: Payer: Self-pay | Admitting: Family Medicine

## 2012-03-12 MED ORDER — BENICAR 20 MG PO TABS: 20.0000 mg | ORAL_TABLET | Freq: Every day | ORAL | Status: DC

## 2012-03-12 MED ORDER — PANTOPRAZOLE SODIUM 40 MG PO TBEC: 40.0000 mg | DELAYED_RELEASE_TABLET | Freq: Every day | ORAL | Status: DC

## 2012-03-12 NOTE — Telephone Encounter (Signed)
Pt called and said she needs refills for Brand Name Only - olmesartan (BENICAR) 20 MG tablet and esomeprazole (NEXIUM) 40 MG capsule to [facility]. 90 day supply with 3 refills.

## 2012-04-01 ENCOUNTER — Ambulatory Visit
Admission: RE | Admit: 2012-04-01 | Discharge: 2012-04-01 | Disposition: A | Discharge status: Home / Self Care | Payer: BC Managed Care – PPO | Source: Ambulatory Visit | Attending: Gynecology | Admitting: Gynecology

## 2012-04-01 DIAGNOSIS — Z1231 Encounter for screening mammogram for malignant neoplasm of breast: Secondary | ICD-10-CM

## 2012-04-06 ENCOUNTER — Other Ambulatory Visit: Payer: Self-pay | Admitting: Gynecology

## 2012-04-06 DIAGNOSIS — N63 Unspecified lump in unspecified breast: Secondary | ICD-10-CM

## 2012-04-07 ENCOUNTER — Ambulatory Visit
Admission: RE | Admit: 2012-04-07 | Discharge: 2012-04-07 | Disposition: A | Discharge status: Home / Self Care | Payer: BC Managed Care – PPO | Source: Ambulatory Visit | Attending: Gynecology | Admitting: Gynecology

## 2012-04-07 DIAGNOSIS — N63 Unspecified lump in unspecified breast: Secondary | ICD-10-CM

## 2012-06-24 ENCOUNTER — Encounter: Payer: Self-pay | Admitting: Family Medicine

## 2012-06-24 ENCOUNTER — Ambulatory Visit (INDEPENDENT_AMBULATORY_CARE_PROVIDER_SITE_OTHER): Payer: BC Managed Care – PPO | Admitting: Family Medicine

## 2012-06-24 VITALS — BP 122/78 | HR 76 | Temp 98.5°F | Wt 106.0 lb

## 2012-06-24 DIAGNOSIS — R600 Localized edema: Secondary | ICD-10-CM

## 2012-06-24 DIAGNOSIS — R5383 Other fatigue: Secondary | ICD-10-CM

## 2012-06-24 DIAGNOSIS — R609 Edema, unspecified: Secondary | ICD-10-CM

## 2012-06-24 DIAGNOSIS — I10 Essential (primary) hypertension: Secondary | ICD-10-CM

## 2012-06-24 DIAGNOSIS — R5381 Other malaise: Secondary | ICD-10-CM

## 2012-06-24 NOTE — Progress Notes (Signed)
  Subjective:    Patient ID: Gabrielle Brock, female    DOB: 08-12-1956, 56 y.o.   MRN: [id number]  HPI  Patient seen with complaints of edema over the weekend.   Few days ago she note some swelling  Ankles, lower legs, and wrists. No warmth or erythema. Took HCTZ for a few days and her edema has resolved. She lost about 2 pounds in weight after taking HCTZ. She recently had stopped taking Benicar and went back on this last week. Taking 20 mg one half tablet daily. Blood pressure stable by home readings. No recent travels. No dietary changes. Still exercising regularly. No dyspnea.  Intermittent right upper back pain. Previously had received physical therapy for similar trapezius strain. Symptoms currently stable. No radiculopathy symptoms.  Complains of some late day fatigue. Increased job stress. Generally sleeping well. Still exercising several days per week. Labs back in February with physical were all normal  Past Medical History  Diagnosis Date  . Hypertension   . Depression   . Osteoporosis   . Hyperlipidemia   . Seasonal allergies   . MVP (mitral valve prolapse) ASYMPTOMATIC  . GERD (gastroesophageal reflux disease)   . History of concussion 20 YRS AGO  . Arthritis    Past Surgical History  Procedure Date  . Breast biopsy 2006    RIGHT BREAST -  BENIGN  . Strabismus surgery AGE 61  . Laser retinal repair left eye 20 YRS AGO  . Hysteroscopy w/d&c 11/27/2011    Procedure: DILATATION AND CURETTAGE /HYSTEROSCOPY;  Surgeon: Meriel Pica, MD;  Location: [facility];  Service: Gynecology;  Laterality: N/A;    reports that she has never smoked. She has never used smokeless tobacco. She reports that she drinks alcohol. She reports that she does not use illicit drugs. family history includes Alzheimer's disease in her mother; Heart disease in her other; Heart disease (age of onset:74) in her father; Hyperlipidemia in her other; Hypertension in her other; and Parkinsonism  in her father. Allergies  Allergen Reactions  . Cefaclor Hives      Review of Systems  Constitutional: Positive for fatigue. Negative for fever, chills and appetite change.  Respiratory: Negative for cough and shortness of breath.   Cardiovascular: Positive for leg swelling. Negative for chest pain and palpitations.  Gastrointestinal: Negative for abdominal pain.  Genitourinary: Negative for dysuria.  Musculoskeletal: Negative for myalgias.  Skin: Negative for rash.  Neurological: Negative for dizziness.  Hematological: Negative for adenopathy.       Objective:   Physical Exam  Constitutional: She appears well-developed and well-nourished.  Neck: Neck supple. No thyromegaly present.  Cardiovascular: Normal rate and regular rhythm.   Pulmonary/Chest: Effort normal and breath sounds normal. No respiratory distress. She has no wheezes. She has no rales.  Musculoskeletal: She exhibits no edema.  Lymphadenopathy:    She has no cervical adenopathy.          Assessment & Plan:  #1 hypertension. Stable by several home readings  #2 transient edema. Improved following couple days of HCTZ. Continue to take this as needed. She generally follows low-sodium diet #3 fatigue. Recent lab work unremarkable. Good exercise tolerance. Question stress related.

## 2012-06-30 ENCOUNTER — Other Ambulatory Visit: Payer: Self-pay | Admitting: Family Medicine

## 2012-08-04 ENCOUNTER — Telehealth: Payer: Self-pay | Admitting: Family Medicine

## 2012-08-04 NOTE — Telephone Encounter (Signed)
Caller: Tonita/Patient; Patient Name: Gabrielle Brock; PCP: Evelena Peat [facility]); Best Callback Phone Number: [phone];  Calling regarding abdominal pain/discomfort/gas, frequent bowel movements that started 08/03/12, afebrile. No diarrhea reported. Started Zpack on 08/01/12 and states stomach upset since the antibiotic. Wants to know if she should continue the medicine.  Emergent signs and symptoms ruled out as per Abdominal Pain protocol except for see in 24 hours, states she will call back on 08/05/12 if no better. Call back parameters given.  Per care advice take prescribed medicine as directed.

## 2012-08-07 ENCOUNTER — Other Ambulatory Visit: Payer: Self-pay | Admitting: Family Medicine

## 2012-08-07 MED ORDER — OLMESARTAN MEDOXOMIL 5 MG PO TABS: 5.0000 mg | ORAL_TABLET | Freq: Every day | ORAL | Status: DC

## 2012-08-07 NOTE — Telephone Encounter (Signed)
Dr Caryl Never out of office, will send Benicar 5 mg and have pt monitor her BP.  Pt informed and voiced understanding

## 2012-08-07 NOTE — Telephone Encounter (Signed)
Caller: [name]/Patient; Patient Name: Gabrielle Brock; PCP: Evelena Peat [facility]); Best Callback Phone Number: [phone]; Reason for call: Needs to refill Benicar for Blood pressure.  She states she has 20mg  tablets and is cutting them in half. She is taking 10mg  daily. They do not make a 10mg  tablet.   She states her blood pressure is so low she would like to try 5mg  tablet .CAN PHYSICIAN CALL HER IN 5MG  TABLETS TO TRY?    She gets her medication filled Alinda Deem [phone]. Advised I would forward to physician for review.  Patient states if physician declines. She still needs a refill of medication

## 2012-08-28 ENCOUNTER — Other Ambulatory Visit: Payer: Self-pay | Admitting: Family Medicine

## 2012-08-28 ENCOUNTER — Telehealth: Payer: Self-pay | Admitting: Family Medicine

## 2012-08-28 MED ORDER — BENICAR 20 MG PO TABS: 20.0000 mg | ORAL_TABLET | Freq: Every day | ORAL | Status: DC

## 2012-08-28 NOTE — Telephone Encounter (Signed)
Pt requesting 1 week supply of Benicar 5mg  since she will be out of town working.  Please send to [facility]

## 2012-08-31 ENCOUNTER — Other Ambulatory Visit: Payer: Self-pay | Admitting: Family Medicine

## 2012-08-31 MED ORDER — OLMESARTAN MEDOXOMIL 5 MG PO TABS: 5.0000 mg | ORAL_TABLET | Freq: Every day | ORAL | Status: DC

## 2012-08-31 NOTE — Telephone Encounter (Signed)
Pt needs new rx benicar 5 mg #90 with 3 refills sent to [facility]

## 2012-09-03 ENCOUNTER — Encounter: Payer: Self-pay | Admitting: Family Medicine

## 2012-10-07 ENCOUNTER — Telehealth: Payer: Self-pay | Admitting: Family Medicine

## 2012-10-07 NOTE — Telephone Encounter (Signed)
She can try calling first.  We could refer but would generally recommend we see first.  We might be able to dx and assist with treatment.

## 2012-10-07 NOTE — Telephone Encounter (Signed)
Please advise 

## 2012-10-07 NOTE — Telephone Encounter (Signed)
Pt would like referral for orthopedic. Pt's shoulder has bothered her for 3 months and is no better. Can she call her on her own, or do we have to refer? Pt says ok to leave message on cell.

## 2012-10-08 NOTE — Telephone Encounter (Signed)
Pt informed and she voiced understanding.  Phone number provided for [employer]

## 2012-12-04 ENCOUNTER — Other Ambulatory Visit: Payer: Self-pay | Admitting: *Deleted

## 2012-12-04 DIAGNOSIS — Z0000 Encounter for general adult medical examination without abnormal findings: Secondary | ICD-10-CM

## 2012-12-24 ENCOUNTER — Other Ambulatory Visit (INDEPENDENT_AMBULATORY_CARE_PROVIDER_SITE_OTHER): Payer: BC Managed Care – PPO

## 2012-12-24 DIAGNOSIS — Z0000 Encounter for general adult medical examination without abnormal findings: Secondary | ICD-10-CM

## 2012-12-24 LAB — CBC WITH DIFFERENTIAL/PLATELET
Basophils Absolute: 0.1 10*3/uL (ref 0.0–0.1)
Eosinophils Absolute: 0.5 10*3/uL (ref 0.0–0.7)
Lymphocytes Relative: 24.7 % (ref 12.0–46.0)
MCHC: 33.8 g/dL (ref 30.0–36.0)
Neutro Abs: 5.9 10*3/uL (ref 1.4–7.7)
Neutrophils Relative %: 61.8 % (ref 43.0–77.0)
RDW: 14 % (ref 11.5–14.6)

## 2013-01-01 ENCOUNTER — Encounter: Payer: BC Managed Care – PPO | Admitting: Family Medicine

## 2013-01-18 ENCOUNTER — Encounter: Payer: Self-pay | Admitting: Family Medicine

## 2013-01-18 ENCOUNTER — Ambulatory Visit (INDEPENDENT_AMBULATORY_CARE_PROVIDER_SITE_OTHER): Payer: BC Managed Care – PPO | Admitting: Family Medicine

## 2013-01-18 VITALS — BP 130/70 | Temp 98.3°F

## 2013-01-18 DIAGNOSIS — J209 Acute bronchitis, unspecified: Secondary | ICD-10-CM

## 2013-01-18 NOTE — Progress Notes (Signed)
  Subjective:    Patient ID: Gabrielle Brock, female    DOB: 1956-08-28, 57 y.o.   MRN: [id number]  HPI Acute illness Onset Saturday. Mild cough initially, nasal congestion, fatigue No body aches. Sunday "fever" 99.8.   No significant sore throat. Patient is a nonsmoker. No history of asthma   Review of Systems  Constitutional: Negative for chills and unexpected weight change.  HENT: Negative for sore throat.   Respiratory: Positive for cough. Negative for shortness of breath and wheezing.   Neurological: Negative for dizziness.       Objective:   Physical Exam  HENT:  Right Ear: External ear normal.  Left Ear: External ear normal.  Mouth/Throat: Oropharynx is clear and moist.  Neck: Neck supple.  Cardiovascular: Normal rate and regular rhythm.   Pulmonary/Chest: Effort normal and breath sounds normal. No respiratory distress. She has no wheezes. She has no rales.  Lymphadenopathy:    She has no cervical adenopathy.          Assessment & Plan:  Acute bronchitis. Suspect viral. Continue Mucinex. Followup promptly for fever or worsening symptoms

## 2013-01-18 NOTE — Patient Instructions (Addendum)

## 2013-01-21 ENCOUNTER — Encounter: Payer: Self-pay | Admitting: Family Medicine

## 2013-01-21 ENCOUNTER — Other Ambulatory Visit (INDEPENDENT_AMBULATORY_CARE_PROVIDER_SITE_OTHER): Payer: BC Managed Care – PPO

## 2013-01-21 DIAGNOSIS — Z0000 Encounter for general adult medical examination without abnormal findings: Secondary | ICD-10-CM

## 2013-01-21 LAB — URINALYSIS
Bilirubin Urine: NEGATIVE
Hgb urine dipstick: NEGATIVE
Ketones, ur: NEGATIVE
Nitrite: NEGATIVE
Total Protein, Urine: NEGATIVE
Urine Glucose: NEGATIVE

## 2013-01-21 LAB — BASIC METABOLIC PANEL
BUN: 15 mg/dL (ref 6–23)
CO2: 28 mEq/L (ref 19–32)
Calcium: 8.4 mg/dL (ref 8.4–10.5)
Creatinine, Ser: 0.8 mg/dL (ref 0.4–1.2)
Glucose, Bld: 89 mg/dL (ref 70–99)

## 2013-01-21 LAB — HEPATIC FUNCTION PANEL
ALT: 23 U/L (ref 0–35)
AST: 23 U/L (ref 0–37)
Total Bilirubin: 0.4 mg/dL (ref 0.3–1.2)

## 2013-01-21 LAB — LIPID PANEL
HDL: 56.3 mg/dL (ref >39.00)
Triglycerides: 43 mg/dL (ref 0.0–149.0)

## 2013-01-24 ENCOUNTER — Encounter: Payer: Self-pay | Admitting: Family Medicine

## 2013-01-26 ENCOUNTER — Ambulatory Visit (INDEPENDENT_AMBULATORY_CARE_PROVIDER_SITE_OTHER): Payer: BC Managed Care – PPO | Admitting: Family Medicine

## 2013-01-26 ENCOUNTER — Encounter: Payer: Self-pay | Admitting: Family Medicine

## 2013-01-26 VITALS — BP 128/80 | HR 100 | Temp 98.9°F | Wt 106.0 lb

## 2013-01-26 DIAGNOSIS — J069 Acute upper respiratory infection, unspecified: Secondary | ICD-10-CM

## 2013-01-26 MED ORDER — FLUTICASONE PROPIONATE HFA 44 MCG/ACT IN AERO: 1.0000 | INHALATION_SPRAY | Freq: Two times a day (BID) | RESPIRATORY_TRACT | Status: DC

## 2013-01-26 MED ORDER — DOXYCYCLINE HYCLATE 100 MG PO TABS: 100.0000 mg | ORAL_TABLET | Freq: Two times a day (BID) | ORAL | Status: DC

## 2013-01-26 NOTE — Progress Notes (Signed)
Chief Complaint  Patient presents with  . Cough    worse; coughing up yellow mucus    HPI:  Acute visit for cough: -started: about 2 weeks ago, seen 3/24 for nasal congestion and cough and presumed viral bronchitis - seems like symptoms got worse today -symptoms:nasal congestion, sore throat, cough - productive of yellow mucus, feels like has some wheezing -denies:fever, SOB, NVD, tooth pain, strep or mono exposure -has tried: musinex -sick contacts: yes -Hx of: seasonal allergies, GERD   ROS: See pertinent positives and negatives per HPI.  Past Medical History  Diagnosis Date  . Hypertension   . Depression   . Osteoporosis   . Hyperlipidemia   . Seasonal allergies   . MVP (mitral valve prolapse) ASYMPTOMATIC  . GERD (gastroesophageal reflux disease)   . History of concussion 20 YRS AGO  . Arthritis     Family History  Problem Relation Age of Onset  . Alzheimer's disease Mother   . Parkinsonism Father   . Heart disease Father 49    cabg  . Hyperlipidemia Other   . Hypertension Other   . Heart disease Other     History   Social History  . Marital Status: Single    Spouse Name: N/A    Number of Children: N/A  . Years of Education: N/A   Social History Main Topics  . Smoking status: Never Smoker   . Smokeless tobacco: Never Used  . Alcohol Use: Yes     Comment: OCCASIONAL  . Drug Use: No  . Sexually Active: None   Other Topics Concern  . None   Social History Narrative  . None    Current outpatient prescriptions:ALPRAZolam (XANAX) 0.25 MG tablet, Take 0.75 mg by mouth at bedtime as needed. 2 and 1/2 at bedtime, Disp: , Rfl: ;  azelastine (ASTELIN) 137 MCG/SPRAY nasal spray, USE 1 SPRAY IN EACH NOSTRIL TWICE DAILY AS NEEDED AS DIRECTED, Disp: 30 mL, Rfl: 3;  BENICAR 20 MG tablet, Take 1 tablet (20 mg total) by mouth daily., Disp: 7 tablet, Rfl: 0;  BIOTIN PO, Take 1 tablet by mouth daily., Disp: , Rfl:  butalbital-acetaminophen-caffeine (FIORICET, ESGIC)  50-325-40 MG per tablet, Take 1 tablet by mouth 2 (two) times daily as needed. , Disp: , Rfl: ;  CELEBREX 200 MG capsule, , Disp: , Rfl: ;  cholecalciferol (VITAMIN D) 1000 UNITS tablet, Take 1,000 Units by mouth daily., Disp: , Rfl: ;  esomeprazole (NEXIUM) 40 MG capsule, Take 1 capsule (40 mg total) by mouth every evening., Disp: 90 capsule, Rfl: 3 ibandronate (BONIVA) 150 MG tablet, FOR INSTRUCTIONS ON THE    PROPER DOSING OF YOUR      MEDICATION REFER TO YOUR   PARTICIPANT COUNSELING FORM, Disp: 3 tablet, Rfl: 3;  maprotiline (LUDIOMIL) 75 MG tablet, Take 75 mg by mouth at bedtime. , Disp: , Rfl: ;  Multiple Vitamins-Iron (MULTI-VITAMIN/IRON PO), Take 1 tablet by mouth daily., Disp: , Rfl:  olmesartan (BENICAR) 5 MG tablet, Take 1 tablet (5 mg total) by mouth daily., Disp: 90 tablet, Rfl: 3;  pantoprazole (PROTONIX) 40 MG tablet, Take 1 tablet (40 mg total) by mouth daily., Disp: 90 tablet, Rfl: 3;  zolpidem (AMBIEN) 10 MG tablet, Take 10 mg by mouth at bedtime as needed. TAKES 1/2 TAB (5MG ), Disp: , Rfl: ;  doxycycline (VIBRA-TABS) 100 MG tablet, Take 1 tablet (100 mg total) by mouth 2 (two) times daily., Disp: 20 tablet, Rfl: 0 fluticasone (FLOVENT HFA) 44 MCG/ACT inhaler, Inhale  1 puff into the lungs 2 (two) times daily., Disp: 2 Inhaler, Rfl: 0  EXAM:  Filed Vitals:   01/26/13 1615  BP: 128/80  Pulse: 100  Temp: 98.9 F (37.2 C)    Body mass index is 19.07 kg/(m^2).  GENERAL: vitals reviewed and listed above, alert, oriented, appears well hydrated and in no acute distress  HEENT: atraumatic, conjunttiva clear, no obvious abnormalities on inspection of external nose and ears, normal appearance of ear canals and TMs, clear nasal congestion, mild post oropharyngeal erythema with PND, no tonsillar edema or exudate, no sinus TTP  NECK: no obvious masses on inspection  LUNGS: clear to auscultation bilaterally, no wheezes, rales or rhonchi, good air movement  CV: HRRR, no peripheral  edema  MS: moves all extremities without noticeable abnormality  PSYCH: pleasant and cooperative, no obvious depression or anxiety  ASSESSMENT AND PLAN:  Discussed the following assessment and plan:  Upper respiratory infection - Plan: fluticasone (FLOVENT HFA) 44 MCG/ACT inhaler, doxycycline (VIBRA-TABS) 100 MG tablet  -advised likely viral but given worsening after two weeks of symptoms further tx reasonable - discussed risks of inhaled CS as per hx likely post inf RAD, abx - rx sent. Return precautions. -Patient advised to return or notify a doctor immediately if symptoms worsen or persist or new concerns arise.  There are no Patient Instructions on file for this visit.   Kriste Basque R.

## 2013-01-28 ENCOUNTER — Ambulatory Visit (INDEPENDENT_AMBULATORY_CARE_PROVIDER_SITE_OTHER): Payer: BC Managed Care – PPO | Admitting: Family Medicine

## 2013-01-28 ENCOUNTER — Encounter: Payer: Self-pay | Admitting: Family Medicine

## 2013-01-28 VITALS — BP 110/70 | HR 72 | Temp 98.0°F | Resp 12 | Ht 62.5 in | Wt 108.0 lb

## 2013-01-28 DIAGNOSIS — Z0000 Encounter for general adult medical examination without abnormal findings: Secondary | ICD-10-CM

## 2013-01-28 NOTE — Progress Notes (Signed)
Subjective:    Patient ID: Gabrielle Brock, female    DOB: 23-Apr-1956, 57 y.o.   MRN: [id number]  HPI Patient seen for complete physical. Her chronic problems include history of hypertension, hyperlipidemia, osteoporosis, and GERD. Recently dealing with a bronchial illness.  Couple of days ago started on doxycycline. No fever. Continued cough productive of yellow sputum.  Patient apparently due for tetanus but declines getting this today. Osteoporosis treated with Boniva. She is coming up on five-year mark and requests stopping this in a few months. Last DEXA scan over 2 years ago  Last colonoscopy about 10 years ago. She plans to schedule this year but not yet. Patient is nonsmoker. Exercises regularly with swimming. She takes regular vitamin D but no calcium supplementation. She does get lots of dietary sources of calcium.  Mammograms are up to date. She has seen gynecologist in the past.  Past Medical History  Diagnosis Date  . Hypertension   . Depression   . Osteoporosis   . Hyperlipidemia   . Seasonal allergies   . MVP (mitral valve prolapse) ASYMPTOMATIC  . GERD (gastroesophageal reflux disease)   . History of concussion 20 YRS AGO  . Arthritis    Past Surgical History  Procedure Laterality Date  . Breast biopsy  2006    RIGHT BREAST -  BENIGN  . Strabismus surgery  AGE 27  . Laser retinal repair left eye  20 YRS AGO  . Hysteroscopy w/d&c  11/27/2011    Procedure: DILATATION AND CURETTAGE /HYSTEROSCOPY;  Surgeon: Meriel Pica, MD;  Location: [facility];  Service: Gynecology;  Laterality: N/A;    reports that she has never smoked. She has never used smokeless tobacco. She reports that  drinks alcohol. She reports that she does not use illicit drugs. family history includes Alzheimer's disease in her mother; Heart disease in her other; Heart disease (age of onset: 3) in her father; Hyperlipidemia in her other; Hypertension in her other; and Parkinsonism  in her father. Allergies  Allergen Reactions  . Cefaclor Hives    ceclor      Review of Systems  Constitutional: Negative for fever, activity change, appetite change, fatigue and unexpected weight change.  HENT: Negative for hearing loss, ear pain, sore throat and trouble swallowing.   Eyes: Negative for visual disturbance.  Respiratory: Positive for cough and wheezing. Negative for shortness of breath.   Cardiovascular: Negative for chest pain and palpitations.  Gastrointestinal: Negative for abdominal pain, diarrhea, constipation and blood in stool.  Genitourinary: Negative for dysuria and hematuria.  Musculoskeletal: Negative for myalgias, back pain and arthralgias.  Skin: Negative for rash.  Neurological: Negative for dizziness, syncope and headaches.  Hematological: Negative for adenopathy.  Psychiatric/Behavioral: Negative for confusion and dysphoric mood.       Objective:   Physical Exam  Constitutional: She is oriented to person, place, and time. She appears well-developed and well-nourished.  HENT:  Head: Normocephalic and atraumatic.  Eyes: EOM are normal. Pupils are equal, round, and reactive to light.  Neck: Normal range of motion. Neck supple. No thyromegaly present.  Cardiovascular: Normal rate, regular rhythm and normal heart sounds.   No murmur heard. Pulmonary/Chest: Breath sounds normal. No respiratory distress. She has no wheezes. She has no rales.  Abdominal: Soft. Bowel sounds are normal. She exhibits no distension and no mass. There is no tenderness. There is no rebound and no guarding.  Musculoskeletal: Normal range of motion. She exhibits no edema.  Lymphadenopathy:  She has no cervical adenopathy.  Neurological: She is alert and oriented to person, place, and time. She displays normal reflexes. No cranial nerve deficit.  Skin: No rash noted.  Psychiatric: She has a normal mood and affect. Her behavior is normal. Judgment and thought content normal.           Assessment & Plan:  Complete physical. We suggested a few more months of bisphosphonate then holiday off. Continue calcium and vitamin D supplementation. Continue regular weightbearing exercise. She will call when ready to schedule colonoscopy. We've recommended followup DEXA scan later this year and again she is not interested in getting at this time.

## 2013-01-28 NOTE — Patient Instructions (Addendum)
Consider repeat colonoscopy at some point later this year Take Boniva for 3-6 more months then discontinue Continue with daily calcium and Vit D supplement. Follow up promptly for any fever or increasing shortness of breath.

## 2013-02-14 ENCOUNTER — Encounter: Payer: Self-pay | Admitting: Family Medicine

## 2013-02-15 ENCOUNTER — Telehealth: Payer: Self-pay | Admitting: *Deleted

## 2013-02-15 ENCOUNTER — Encounter: Payer: Self-pay | Admitting: Family Medicine

## 2013-02-15 DIAGNOSIS — R682 Dry mouth, unspecified: Secondary | ICD-10-CM

## 2013-02-15 MED ORDER — ESOMEPRAZOLE MAGNESIUM 40 MG PO CPDR: 40.0000 mg | DELAYED_RELEASE_CAPSULE | Freq: Every evening | ORAL | Status: DC

## 2013-02-15 NOTE — Telephone Encounter (Signed)
Pt recently had CPE and she wanted to ask about dry mouth sx and problems, they ran out of time.  Her dentist has mentioned dry mouth at her last 2 cleanings.  Please advise

## 2013-02-15 NOTE — Telephone Encounter (Signed)
Pt having dry mouth symptoms.  No diabetes.  I think she has mentioned some dry eye symptoms as well off and on.  ? Sjogren's syndrome.  I would suggest checking antibodies for Sjogren's .  I have ordered them if she is willing to come by and have drawn.

## 2013-02-16 ENCOUNTER — Ambulatory Visit (INDEPENDENT_AMBULATORY_CARE_PROVIDER_SITE_OTHER): Payer: BC Managed Care – PPO | Admitting: Family Medicine

## 2013-02-16 ENCOUNTER — Encounter: Payer: Self-pay | Admitting: Family Medicine

## 2013-02-16 VITALS — BP 142/88 | HR 78 | Temp 97.6°F | Wt 109.0 lb

## 2013-02-16 DIAGNOSIS — K117 Disturbances of salivary secretion: Secondary | ICD-10-CM

## 2013-02-16 DIAGNOSIS — T148XXA Other injury of unspecified body region, initial encounter: Secondary | ICD-10-CM

## 2013-02-16 DIAGNOSIS — R682 Dry mouth, unspecified: Secondary | ICD-10-CM

## 2013-02-16 NOTE — Telephone Encounter (Signed)
Pt here for OV with Dr Selena Batten and she was informed of lab order and will have drawn now.

## 2013-02-16 NOTE — Addendum Note (Signed)
Addended by: Serena Colonel on: 02/16/2013 09:39 AM   Modules accepted: Orders

## 2013-02-16 NOTE — Patient Instructions (Addendum)
-  heat for 15 minutes twice daily  -tylenol 500-1000mg  twice daily as needed, topical sports cream (with menthol or capsacin)  -stretching and moving daily  -schedule follow with your doctor in 3 weeks

## 2013-02-16 NOTE — Progress Notes (Signed)
Chief Complaint  Patient presents with  . right side pain    HPI:  Acute visit for:  Follow up bronchitis: -had bronchitis recently with lots of coughing and develped some side pain -bronchitis has resolved, but side has continued to hurt -worse witt certain movements (reaching above head), coughing, sneezing  ROS: See pertinent positives and negatives per HPI.  Past Medical History  Diagnosis Date  . Hypertension   . Depression   . Osteoporosis   . Hyperlipidemia   . Seasonal allergies   . MVP (mitral valve prolapse) ASYMPTOMATIC  . GERD (gastroesophageal reflux disease)   . History of concussion 20 YRS AGO  . Arthritis     Family History  Problem Relation Age of Onset  . Alzheimer's disease Mother   . Parkinsonism Father   . Heart disease Father 70    cabg  . Hyperlipidemia Other   . Hypertension Other   . Heart disease Other     History   Social History  . Marital Status: Single    Spouse Name: N/A    Number of Children: N/A  . Years of Education: N/A   Social History Main Topics  . Smoking status: Never Smoker   . Smokeless tobacco: Never Used  . Alcohol Use: Yes     Comment: OCCASIONAL  . Drug Use: No  . Sexually Active: None   Other Topics Concern  . None   Social History Narrative  . None    Current outpatient prescriptions:ALPRAZolam (XANAX) 0.25 MG tablet, Take 0.75 mg by mouth at bedtime as needed. 2 and 1/2 at bedtime, Disp: , Rfl: ;  azelastine (ASTELIN) 137 MCG/SPRAY nasal spray, USE 1 SPRAY IN EACH NOSTRIL TWICE DAILY AS NEEDED AS DIRECTED, Disp: 30 mL, Rfl: 3;  BIOTIN PO, Take 1 tablet by mouth daily., Disp: , Rfl:  butalbital-acetaminophen-caffeine (FIORICET, ESGIC) 50-325-40 MG per tablet, Take 1 tablet by mouth 2 (two) times daily as needed. , Disp: , Rfl: ;  cholecalciferol (VITAMIN D) 1000 UNITS tablet, Take 1,000 Units by mouth daily., Disp: , Rfl: ;  doxycycline (VIBRA-TABS) 100 MG tablet, Take 1 tablet (100 mg total) by mouth 2  (two) times daily., Disp: 20 tablet, Rfl: 0 esomeprazole (NEXIUM) 40 MG capsule, Take 1 capsule (40 mg total) by mouth every evening., Disp: 90 capsule, Rfl: 3;  fluticasone (FLOVENT HFA) 44 MCG/ACT inhaler, Inhale 1 puff into the lungs 2 (two) times daily., Disp: 2 Inhaler, Rfl: 0;  ibandronate (BONIVA) 150 MG tablet, FOR INSTRUCTIONS ON THE    PROPER DOSING OF YOUR      MEDICATION REFER TO YOUR   PARTICIPANT COUNSELING FORM, Disp: 3 tablet, Rfl: 3 maprotiline (LUDIOMIL) 75 MG tablet, Take 75 mg by mouth at bedtime. , Disp: , Rfl: ;  Multiple Vitamins-Minerals (MULTIPLE VITAMINS/WOMENS PO), Take by mouth daily., Disp: , Rfl: ;  olmesartan (BENICAR) 5 MG tablet, Take 1 tablet (5 mg total) by mouth daily., Disp: 90 tablet, Rfl: 3;  zolpidem (AMBIEN) 10 MG tablet, Take 10 mg by mouth at bedtime as needed. TAKES 1/2 TAB (5MG ), Disp: , Rfl:   EXAM:  Filed Vitals:   02/16/13 0905  BP: 142/88  Pulse: 78  Temp: 97.6 F (36.4 C)    Body mass index is 19.61 kg/(m^2).  GENERAL: vitals reviewed and listed above, alert, oriented, appears well hydrated and in no acute distress  HEENT: atraumatic, conjunttiva clear, no obvious abnormalities on inspection of external nose and ears  NECK: no obvious masses  on inspection  LUNGS: clear to auscultation bilaterally, no wheezes, rales, rubs or rhonchi, good air movement  CV: HRRR, no peripheral edema  MS: moves all extremities without noticeable abnormality TTP in R lat LD muscle, otherwise normal exam  PSYCH: pleasant and cooperative, very anxious patient  ASSESSMENT AND PLAN:  Discussed the following assessment and plan:  Muscle strain  -suspect muscle strain form coughing r swimming per exam findings, lungs clear. Recs per below including supportive care, heat, stretching and follow up with PCP in 3 weeks. Offered CXR given her concerns - she deferred today. -Patient advised to return or notify a doctor immediately if symptoms worsen or persist  or new concerns arise.  There are no Patient Instructions on file for this visit.   Kriste Basque R.

## 2013-02-17 ENCOUNTER — Encounter: Payer: Self-pay | Admitting: Family Medicine

## 2013-02-17 NOTE — Telephone Encounter (Signed)
Yes Sharie, probably best to come in.  See you then, nn

## 2013-02-18 ENCOUNTER — Telehealth: Payer: Self-pay | Admitting: Family Medicine

## 2013-02-18 NOTE — Telephone Encounter (Signed)
Pt had bmd done at [facility].

## 2013-02-18 NOTE — Telephone Encounter (Signed)
i have discussed with pt in detail and we are mailing her copy of DEXA.  She is considering coming off Boniva at this time as she has been on bisphosphonates for over 5 years.

## 2013-02-25 ENCOUNTER — Telehealth: Payer: Self-pay | Admitting: Family Medicine

## 2013-02-25 ENCOUNTER — Encounter: Payer: Self-pay | Admitting: Family Medicine

## 2013-02-25 ENCOUNTER — Ambulatory Visit (INDEPENDENT_AMBULATORY_CARE_PROVIDER_SITE_OTHER): Payer: BC Managed Care – PPO

## 2013-02-25 DIAGNOSIS — R059 Cough, unspecified: Secondary | ICD-10-CM

## 2013-02-25 DIAGNOSIS — R05 Cough: Secondary | ICD-10-CM

## 2013-02-25 DIAGNOSIS — R0781 Pleurodynia: Secondary | ICD-10-CM

## 2013-02-25 DIAGNOSIS — R079 Chest pain, unspecified: Secondary | ICD-10-CM

## 2013-02-25 NOTE — Telephone Encounter (Signed)
Patient would like an xray to check for cracked upper right ribs. Please call patient when ordered.

## 2013-02-25 NOTE — Telephone Encounter (Signed)
We can set up CXR but if her main concern is rib (ie rib fracture) rib films would be better.  I'm OK with setting up CXR there as long as we can get results

## 2013-02-25 NOTE — Telephone Encounter (Signed)
I left a message with pt asking the question about rib pain, would rib films be better.  I asked she call back.

## 2013-02-25 NOTE — Telephone Encounter (Signed)
Pt was seen 4/22 by Dr Selena Batten. Bronchitis. Pt's bronchitis is  better. Pt's ribs are still hurting. Would like to schedule chest xray. Pt has travel coming up. Worried about the rib pain.  Pt works in [place] and wants to know if she can go to med center in [place] for [employer].  Pt would like a call back from you. She will be in meeting 9:30-10:00.

## 2013-02-25 NOTE — Telephone Encounter (Signed)
Pt informed

## 2013-02-26 ENCOUNTER — Encounter: Payer: Self-pay | Admitting: Family Medicine

## 2013-03-01 ENCOUNTER — Encounter: Payer: Self-pay | Admitting: Family Medicine

## 2013-03-01 ENCOUNTER — Ambulatory Visit (INDEPENDENT_AMBULATORY_CARE_PROVIDER_SITE_OTHER): Payer: BC Managed Care – PPO | Admitting: Family Medicine

## 2013-03-01 VITALS — BP 140/90 | HR 68 | Temp 98.3°F | Resp 12

## 2013-03-01 DIAGNOSIS — R062 Wheezing: Secondary | ICD-10-CM

## 2013-03-01 NOTE — Patient Instructions (Addendum)
Follow up for any recurrent fever, cough, or shortness of breath.

## 2013-03-01 NOTE — Progress Notes (Signed)
  Subjective:    Patient ID: Gabrielle Brock, female    DOB: May 27, 1956, 57 y.o.   MRN: [id number]  HPI Patient seen today with question of inspiratory wheezes right lower lung She had recent bronchitis with several weeks of coughing her cough is now fully resolve. She had some right lower rib cage pain last week and we obtained written films which did not show any rib fracture. No evidence for pneumothorax or pleural effusion. No infiltrates seen Her cough is fully resolved. No fever. No hemoptysis. No dyspnea. She is exercising regularly without difficulty.  Rib pain is minimal at this point.  She notices a localized wheezing sensation with deep inspiration but not with expiration and this is localized to the right lower lung region. Again, no associated pain or dyspnea. No history of smoking. No appetite or weight changes  Past Medical History  Diagnosis Date  . Hypertension   . Depression   . Osteoporosis   . Hyperlipidemia   . Seasonal allergies   . MVP (mitral valve prolapse) ASYMPTOMATIC  . GERD (gastroesophageal reflux disease)   . History of concussion 20 YRS AGO  . Arthritis    Past Surgical History  Procedure Laterality Date  . Breast biopsy  2006    RIGHT BREAST -  BENIGN  . Strabismus surgery  AGE 51  . Laser retinal repair left eye  20 YRS AGO  . Hysteroscopy w/d&c  11/27/2011    Procedure: DILATATION AND CURETTAGE /HYSTEROSCOPY;  Surgeon: Meriel Pica, MD;  Location: [facility];  Service: Gynecology;  Laterality: N/A;    reports that she has never smoked. She has never used smokeless tobacco. She reports that  drinks alcohol. She reports that she does not use illicit drugs. family history includes Alzheimer's disease in her mother; Heart disease in her other; Heart disease (age of onset: 21) in her father; Hyperlipidemia in her other; Hypertension in her other; and Parkinsonism in her father. Allergies  Allergen Reactions  . Cefaclor Hives    ceclor       Review of Systems  Constitutional: Negative for fever, chills and fatigue.  Respiratory: Negative for cough and shortness of breath.   Cardiovascular: Negative for chest pain.       Objective:   Physical Exam  Constitutional: She appears well-developed and well-nourished.  Cardiovascular: Normal rate and regular rhythm.   Pulmonary/Chest: Effort normal and breath sounds normal. No respiratory distress. She has no rales.  Patient has very localized inspiratory wheezes right lower lung region which is somewhat inconsistent. No expiratory wheezes          Assessment & Plan:  Localized inspiratory wheeze right lung field-?mild scarring from recent respiratory illness. Pulse oximetry 99%. Recent rib films unremarkable with no evidence for effusion, pneumothorax, or infiltrate.  We offered pulmonary referral but at this point she is basically asymptomatic otherwise and recent x-rays unremarkable as above-and she wishes to wait.. Followup promptly for any fever, recurrent cough, or dyspnea

## 2013-03-01 NOTE — Telephone Encounter (Signed)
Pt is here for OV on Monday, 5/5 and Dr Caryl Never will answer her questions.  I was out of office Friday when message came in and Dr Caryl Never was also out of office.

## 2013-03-01 NOTE — Telephone Encounter (Signed)
Pt here for OV on Monday, 5-5.  She reports Fleet Contras did call her friday

## 2013-03-03 ENCOUNTER — Encounter: Payer: Self-pay | Admitting: Family Medicine

## 2013-03-18 ENCOUNTER — Ambulatory Visit: Payer: BC Managed Care – PPO | Admitting: Family Medicine

## 2013-04-12 ENCOUNTER — Encounter: Payer: Self-pay | Admitting: Gastroenterology

## 2013-05-17 ENCOUNTER — Ambulatory Visit (INDEPENDENT_AMBULATORY_CARE_PROVIDER_SITE_OTHER): Payer: BC Managed Care – PPO | Admitting: Gastroenterology

## 2013-05-17 ENCOUNTER — Encounter: Payer: Self-pay | Admitting: Gastroenterology

## 2013-05-17 VITALS — BP 120/80 | HR 72 | Ht 62.5 in | Wt 108.6 lb

## 2013-05-17 DIAGNOSIS — R141 Gas pain: Secondary | ICD-10-CM

## 2013-05-17 DIAGNOSIS — K648 Other hemorrhoids: Secondary | ICD-10-CM

## 2013-05-17 DIAGNOSIS — R143 Flatulence: Secondary | ICD-10-CM

## 2013-05-17 DIAGNOSIS — Z1211 Encounter for screening for malignant neoplasm of colon: Secondary | ICD-10-CM | POA: Insufficient documentation

## 2013-05-17 DIAGNOSIS — K219 Gastro-esophageal reflux disease without esophagitis: Secondary | ICD-10-CM

## 2013-05-17 NOTE — Assessment & Plan Note (Signed)
Asymptomatic. Plan no further therapy

## 2013-05-17 NOTE — Patient Instructions (Addendum)
It has been recommended to you by your physician that you have a(n)Colonoscopy  completed. Per your request, we did not schedule the procedure(s) today. Please contact our office at [phone] should you decide to have the procedure completed. 

## 2013-05-17 NOTE — Assessment & Plan Note (Signed)
Plan screening colonoscopy 

## 2013-05-17 NOTE — Assessment & Plan Note (Signed)
Symptoms are very nonspecific and may be related to diet.

## 2013-05-17 NOTE — Assessment & Plan Note (Signed)
Symptoms well controlled with Nexium. 

## 2013-05-17 NOTE — Progress Notes (Signed)
History of Present Illness: 57 year old white female referred for screening colonoscopy. Last exam was 10 years ago where internal hemorrhoids were seen. She's undergone band ligation of hemorrhoids in the past. She has no complaints including rectal pain or bleeding. She does notice  abdominal bloating. She takes Nexium for control of pyrosis. She complains of excess intestinal gas. This has been a lifelong problem.    Past Medical History  Diagnosis Date  . Hypertension   . Depression   . Osteoporosis   . Hyperlipidemia   . Seasonal allergies   . MVP (mitral valve prolapse) ASYMPTOMATIC  . GERD (gastroesophageal reflux disease)   . History of concussion 20 YRS AGO  . Arthritis   . Chronic headaches    Past Surgical History  Procedure Laterality Date  . Breast biopsy  2006    RIGHT BREAST -  BENIGN  . Strabismus surgery  AGE 52  . Laser retinal repair left eye  20 YRS AGO  . Hysteroscopy w/d&c  11/27/2011    Procedure: DILATATION AND CURETTAGE /HYSTEROSCOPY;  Surgeon: Meriel Pica, MD;  Location: [facility];  Service: Gynecology;  Laterality: N/A;   family history includes Alzheimer's disease in her mother; Heart disease in her other; Heart disease (age of onset: 18) in her father; Hyperlipidemia in her other; Hypertension in her other; and Parkinsonism in her father. Current Outpatient Prescriptions  Medication Sig Dispense Refill  . ALPRAZolam (XANAX) 0.25 MG tablet Take 0.75 mg by mouth at bedtime as needed. 2 and 1/2 at bedtime      . azelastine (ASTELIN) 137 MCG/SPRAY nasal spray USE 1 SPRAY IN EACH NOSTRIL TWICE DAILY AS NEEDED AS DIRECTED  30 mL  3  . BIOTIN PO Take 1 tablet by mouth daily.      . butalbital-acetaminophen-caffeine (FIORICET, ESGIC) 50-325-40 MG per tablet Take 1 tablet by mouth 2 (two) times daily as needed.       . cholecalciferol (VITAMIN D) 1000 UNITS tablet Take 1,000 Units by mouth daily.      Marland Kitchen esomeprazole (NEXIUM) 40 MG capsule  Take 1 capsule (40 mg total) by mouth every evening.  90 capsule  3  . maprotiline (LUDIOMIL) 75 MG tablet Take 75 mg by mouth at bedtime.       . Multiple Vitamins-Minerals (MULTIPLE VITAMINS/WOMENS PO) Take by mouth daily.      Marland Kitchen olmesartan (BENICAR) 5 MG tablet Take 1 tablet (5 mg total) by mouth daily.  90 tablet  3  . zolpidem (AMBIEN) 10 MG tablet Take 10 mg by mouth at bedtime as needed. TAKES 1/2 TAB (5MG )       No current facility-administered medications for this visit.   Allergies as of 05/17/2013 - Review Complete 05/17/2013  Allergen Reaction Noted  . Cefaclor Hives 06/28/2009    reports that she has never smoked. She has never used smokeless tobacco. She reports that  drinks alcohol. She reports that she does not use illicit drugs.     Review of Systems: Pertinent positive and negative review of systems were noted in the above HPI section. All other review of systems were otherwise negative.  Vital signs were reviewed in today's medical record Physical Exam: General: Well developed , well nourished, no acute distress Skin: anicteric Head: Normocephalic and atraumatic Eyes:  sclerae anicteric, EOMI Ears: Normal auditory acuity Mouth: No deformity or lesions Neck: Supple, no masses or thyromegaly Lungs: Clear throughout to auscultation Heart: Regular rate and rhythm; no murmurs, rubs or  bruits Abdomen: Soft, non tender and non distended. No masses, hepatosplenomegaly or hernias noted. Normal Bowel sounds Rectal:deferred Musculoskeletal: Symmetrical with no gross deformities  Skin: No lesions on visible extremities Pulses:  Normal pulses noted Extremities: No clubbing, cyanosis, edema or deformities noted Neurological: Alert oriented x 4, grossly nonfocal Cervical Nodes:  No significant cervical adenopathy Inguinal Nodes: No significant inguinal adenopathy Psychological:  Alert and cooperative. Normal mood and affect

## 2013-08-08 ENCOUNTER — Encounter: Payer: Self-pay | Admitting: Family Medicine

## 2013-08-08 ENCOUNTER — Other Ambulatory Visit: Payer: Self-pay | Admitting: Family Medicine

## 2013-08-25 ENCOUNTER — Encounter: Payer: Self-pay | Admitting: Family Medicine

## 2013-09-02 ENCOUNTER — Other Ambulatory Visit: Payer: Self-pay

## 2013-12-30 ENCOUNTER — Telehealth: Payer: Self-pay | Admitting: Family Medicine

## 2013-12-30 ENCOUNTER — Other Ambulatory Visit: Payer: Self-pay | Admitting: Family Medicine

## 2013-12-30 DIAGNOSIS — Z0000 Encounter for general adult medical examination without abnormal findings: Secondary | ICD-10-CM

## 2013-12-30 NOTE — Telephone Encounter (Signed)
Pt has been sch for 01-21-14 at 9am since 8.30am slot is unavailable

## 2013-12-30 NOTE — Telephone Encounter (Signed)
Yes and labs are ordered

## 2013-12-30 NOTE — Telephone Encounter (Signed)
Pt would like a cpx at 830am this month . Can I create 30 min slot? Pt also would like to go to [facility] to have cpx labs drawn.

## 2014-01-05 ENCOUNTER — Other Ambulatory Visit (INDEPENDENT_AMBULATORY_CARE_PROVIDER_SITE_OTHER): Payer: BC Managed Care – PPO

## 2014-01-05 ENCOUNTER — Other Ambulatory Visit: Payer: Self-pay | Admitting: Family Medicine

## 2014-01-05 DIAGNOSIS — Z0000 Encounter for general adult medical examination without abnormal findings: Secondary | ICD-10-CM

## 2014-01-05 LAB — CBC WITH DIFFERENTIAL/PLATELET
BASOS PCT: 0.5 % (ref 0.0–3.0)
Basophils Absolute: 0 10*3/uL (ref 0.0–0.1)
EOS ABS: 0.5 10*3/uL (ref 0.0–0.7)
Eosinophils Relative: 6.5 % — ABNORMAL HIGH (ref 0.0–5.0)
HCT: 36.7 % (ref 36.0–46.0)
Hemoglobin: 12.3 g/dL (ref 12.0–15.0)
LYMPHS PCT: 30.7 % (ref 12.0–46.0)
Lymphs Abs: 2.4 10*3/uL (ref 0.7–4.0)
MCHC: 33.6 g/dL (ref 30.0–36.0)
MCV: 89.9 fl (ref 78.0–100.0)
MONOS PCT: 7.8 % (ref 3.0–12.0)
Monocytes Absolute: 0.6 10*3/uL (ref 0.1–1.0)
NEUTROS PCT: 54.5 % (ref 43.0–77.0)
Neutro Abs: 4.3 10*3/uL (ref 1.4–7.7)
Platelets: 273 10*3/uL (ref 150.0–400.0)
RBC: 4.09 Mil/uL (ref 3.87–5.11)
RDW: 13.2 % (ref 11.5–14.6)
WBC: 7.8 10*3/uL (ref 4.5–10.5)

## 2014-01-05 LAB — LIPID PANEL
CHOL/HDL RATIO: 3
CHOLESTEROL: 207 mg/dL — AB (ref 0–200)
HDL: 67.5 mg/dL (ref >39.00)
LDL CALC: 131 mg/dL — AB (ref 0–99)
Triglycerides: 41 mg/dL (ref 0.0–149.0)
VLDL: 8.2 mg/dL (ref 0.0–40.0)

## 2014-01-05 LAB — HEPATIC FUNCTION PANEL
ALK PHOS: 68 U/L (ref 39–117)
ALT: 24 U/L (ref 0–35)
AST: 26 U/L (ref 0–37)
Albumin: 4.2 g/dL (ref 3.5–5.2)
BILIRUBIN DIRECT: 0.1 mg/dL (ref 0.0–0.3)
BILIRUBIN TOTAL: 0.6 mg/dL (ref 0.3–1.2)
Total Protein: 7.2 g/dL (ref 6.0–8.3)

## 2014-01-05 LAB — URINALYSIS
Bilirubin Urine: NEGATIVE
HGB URINE DIPSTICK: NEGATIVE
Ketones, ur: NEGATIVE
Leukocytes, UA: NEGATIVE
NITRITE: NEGATIVE
Specific Gravity, Urine: 1.01 (ref 1.000–1.030)
TOTAL PROTEIN, URINE-UPE24: NEGATIVE
UROBILINOGEN UA: 0.2 (ref 0.0–1.0)
Urine Glucose: NEGATIVE
pH: 6 (ref 5.0–8.0)

## 2014-01-05 LAB — BASIC METABOLIC PANEL
BUN: 21 mg/dL (ref 6–23)
CHLORIDE: 104 meq/L (ref 96–112)
CO2: 28 mEq/L (ref 19–32)
Calcium: 9.2 mg/dL (ref 8.4–10.5)
Creatinine, Ser: 0.8 mg/dL (ref 0.4–1.2)
GFR: 85.01 mL/min (ref >60.00)
Glucose, Bld: 94 mg/dL (ref 70–99)
POTASSIUM: 4.5 meq/L (ref 3.5–5.1)
SODIUM: 138 meq/L (ref 135–145)

## 2014-01-05 LAB — TSH: TSH: 1.8 u[IU]/mL (ref 0.35–5.50)

## 2014-01-21 ENCOUNTER — Encounter: Payer: Self-pay | Admitting: Family Medicine

## 2014-01-21 ENCOUNTER — Ambulatory Visit (INDEPENDENT_AMBULATORY_CARE_PROVIDER_SITE_OTHER): Payer: BC Managed Care – PPO | Admitting: Family Medicine

## 2014-01-21 VITALS — BP 120/80 | HR 99 | Temp 98.1°F | Ht 62.5 in | Wt 114.0 lb

## 2014-01-21 DIAGNOSIS — Z0000 Encounter for general adult medical examination without abnormal findings: Secondary | ICD-10-CM

## 2014-01-21 MED ORDER — ZOLPIDEM TARTRATE 5 MG PO TABS: 5.0000 mg | ORAL_TABLET | Freq: Every evening | ORAL | Status: DC | PRN

## 2014-01-21 MED ORDER — CELECOXIB 200 MG PO CAPS: 200.0000 mg | ORAL_CAPSULE | Freq: Two times a day (BID) | ORAL | Status: DC

## 2014-01-21 NOTE — Patient Instructions (Signed)
Consider colonoscopy and Tdap at some point this year.

## 2014-01-21 NOTE — Progress Notes (Signed)
Subjective:    Patient ID: Gabrielle Brock, female    DOB: 10/26/1956, 58 y.o.   MRN: [id number]  HPI Patient seen for complete physical. She sees gynecologist regularly. She has history of hypertension which is treated with low-dose Benicar. She has had history her depression is treated by psychiatry. History of osteoporosis. Mild hyperlipidemia. She's had some intermittent GERD symptoms. She is overdue for repeat colonoscopy and is decided against this at this time. She may reconsider later this year. She also needs tetanus booster is not ready to pursue that at this time yet. She is getting mammograms through gynecologist as well as Pap smears.  Past Medical History  Diagnosis Date  . Hypertension   . Depression   . Osteoporosis   . Hyperlipidemia   . Seasonal allergies   . MVP (mitral valve prolapse) ASYMPTOMATIC  . GERD (gastroesophageal reflux disease)   . History of concussion 20 YRS AGO  . Arthritis   . Chronic headaches    Past Surgical History  Procedure Laterality Date  . Breast biopsy  2006    RIGHT BREAST -  BENIGN  . Strabismus surgery  AGE 42  . Laser retinal repair left eye  20 YRS AGO  . Hysteroscopy w/d&c  11/27/2011    Procedure: DILATATION AND CURETTAGE /HYSTEROSCOPY;  Surgeon: Margarette Asal, MD;  Location: [facility];  Service: Gynecology;  Laterality: N/A;    reports that she has never smoked. She has never used smokeless tobacco. She reports that she drinks alcohol. She reports that she does not use illicit drugs. family history includes Alzheimer's disease in her mother; Heart disease in her other; Heart disease (age of onset: 45) in her father; Hyperlipidemia in her other; Hypertension in her other; Parkinsonism in her father. Allergies  Allergen Reactions  . Cefaclor Hives    ceclor      Review of Systems  Constitutional: Negative for fever, activity change, appetite change, fatigue and unexpected weight change.  HENT: Negative for  ear pain, hearing loss, sore throat and trouble swallowing.   Eyes: Negative for visual disturbance.  Respiratory: Negative for cough and shortness of breath.   Cardiovascular: Negative for chest pain and palpitations.  Gastrointestinal: Negative for abdominal pain, diarrhea, constipation and blood in stool.  Endocrine: Negative for polydipsia and polyuria.  Genitourinary: Negative for dysuria and hematuria.  Musculoskeletal: Negative for arthralgias, back pain and myalgias.  Skin: Negative for rash.  Neurological: Negative for dizziness, syncope and headaches.  Hematological: Negative for adenopathy.  Psychiatric/Behavioral: Negative for confusion and dysphoric mood.       Objective:   Physical Exam  Constitutional: She is oriented to person, place, and time. She appears well-developed and well-nourished.  HENT:  Head: Normocephalic and atraumatic.  Eyes: EOM are normal. Pupils are equal, round, and reactive to light.  Neck: Normal range of motion. Neck supple. No thyromegaly present.  Cardiovascular: Normal rate, regular rhythm and normal heart sounds.   No murmur heard. Pulmonary/Chest: Breath sounds normal. No respiratory distress. She has no wheezes. She has no rales.  Abdominal: Soft. Bowel sounds are normal. She exhibits no distension and no mass. There is no tenderness. There is no rebound and no guarding.  Genitourinary:  Per GYN  Musculoskeletal: Normal range of motion. She exhibits no edema.  Lymphadenopathy:    She has no cervical adenopathy.  Neurological: She is alert and oriented to person, place, and time. She displays normal reflexes. No cranial nerve deficit.  Skin: No  rash noted.  Psychiatric: She has a normal mood and affect. Her behavior is normal. Judgment and thought content normal.          Assessment & Plan:  Complete physical. Refill Celebrex for as needed use. Refill Ambien 5 mg which is been on for quite some time. Labs reviewed with no major  concerns. She is encouraged to get tetanus booster this year but she defers at this time. We also occurs repeat colonoscopy this year

## 2014-01-21 NOTE — Progress Notes (Signed)
Pre visit review using our clinic review tool, if applicable. No additional management support is needed unless otherwise documented below in the visit note. 

## 2014-02-01 ENCOUNTER — Telehealth: Payer: Self-pay

## 2014-02-01 NOTE — Telephone Encounter (Signed)
Insurance does not cover Gabrielle Brock 5 mg. Insurance does approve Benazepril, candesartan (HCTZ), Eprosartan 600mg , Fosinopril, Irbesartan (HCTZ), Lisinopril, Losartan, Moexipril, Quinapril, Ramipril, Telmisart (Hctz).   [facility]

## 2014-02-02 NOTE — Telephone Encounter (Signed)
Pt is aware.  

## 2014-02-02 NOTE — Telephone Encounter (Signed)
Can you please do a PA on the patient for her Benicar?

## 2014-02-02 NOTE — Telephone Encounter (Signed)
PA was approved the Benicar. PA [id number] , it is good for 24 months.

## 2014-02-02 NOTE — Telephone Encounter (Signed)
She has been very sensitive to meds in past.  Check with patient first to see if she is willing to make change.

## 2014-02-02 NOTE — Telephone Encounter (Signed)
Left message for patient to return call.

## 2014-02-23 ENCOUNTER — Emergency Department (HOSPITAL_BASED_OUTPATIENT_CLINIC_OR_DEPARTMENT_OTHER)
Admission: RE | Admit: 2014-02-23 | Disposition: A | Discharge status: Home / Self Care | Payer: Self-pay | Source: Emergency Department | Attending: Emergency Medicine | Admitting: Emergency Medicine

## 2014-02-23 ENCOUNTER — Encounter: Payer: Self-pay | Admitting: Family Medicine

## 2014-02-23 LAB — POC URINALYSIS
BILIRUBIN, URINE: NEGATIVE
GLUCOSE,URINE: NEGATIVE
KETONE, URINE: 15 — AB
LEUKOCYTE ESTERASE: NEGATIVE
NITRITE, URINE: NEGATIVE
OCCULT BLOOD, URINE: NEGATIVE
PH URINE: 7 (ref 5.0–8.0)
PROTEIN, URINE: NEGATIVE
SPECIFIC GRAVITY, URINE: 1.015 (ref 1.003–1.030)
UROBILINOGEN URINE: 0.2 (ref 0.2–1.0)

## 2014-02-23 LAB — BLOOD SUGAR FINGERSTICK (POINT OF CARE): FINGERSTICK GLUCOSE: 84 mg/dl (ref 74–160)

## 2014-02-23 MED ORDER — ONDANSETRON HCL 4 MG PO TABS
4.00 mg | ORAL_TABLET | Freq: Four times a day (QID) | ORAL | Status: AC | PRN
Start: 2014-02-23 — End: 2014-02-28

## 2014-02-23 MED ORDER — ONDANSETRON 4 MG PO TBDP
4.00 mg | ORAL_TABLET | Freq: Once | ORAL | Status: AC
Start: 2014-02-23 — End: 2014-02-23
  Administered 2014-02-23: 4 mg via ORAL
  Filled 2014-02-23: qty 1

## 2014-02-23 NOTE — ED Notes (Signed)
Patient Disposition    Patient education for diagnosis, medications, activity, diet and follow-up.  Patient left ED 1:46 PM.  Patient rep received written instructions.  Interpreter to provide instructions: No    Have all existing LDAs been addressed? N/A    Have all IV infusions been stopped? N/A    Discharged to: Discharged to home.

## 2014-02-23 NOTE — ED Triage Note (Signed)
Near syncopal episode at work. Felt nauseas this am and didn't eat breakfast. No pain  Or SOB

## 2014-02-23 NOTE — ED Provider Notes (Signed)
I have reviewed the ED nursing notes and prior records. I have reviewed the patient's past medical history/problem list, allergies, social history and medication list.  I saw this patient primarily.      HPI:  This 58 year old female patient presents with chief complaint of near-syncope.  Patient ate seafood dinner last night, and this morning her stomach was not feeling very well, she had slight headache and was feeling nauseated.  She did not taking any oral intake this morning.  She did take her Nexium and another antacid this morning.  While she was standing she felt dizzy and hot as though she would pass out.  She sat down then walked to a couch and laid down.  The symptoms recurred when she stood up again, so she presents here for further evaluation.  She denies any chest pain or shortness of breath, denies any lower extremity edema.  She does have chronic knee pain, which is unchanged.  Apparently, her blood pressure was slightly low by EMS (90/60).  Blood sugar was 90 per EMS.  Of [place]  This was a 5 hour flight, but patient did get up during the flight and go to the bathroom.    ROS: Pertinent positives were reviewed as per the HPI above. All other systems were reviewed and are negative.    Past Medical History/Problem List:  No past medical history on file.  There is no problem list on file for this patient.      Past Surgical History:  No past surgical history on file.    Medications:     No current facility-administered medications for this encounter.  Current Outpatient Prescriptions:  Maprotiline HCl (LUDIOMIL OR) Take  by mouth. Disp:  Rfl:    ALPRAZolam (XANAX) 0.25 MG tablet Take 0.25 mg by mouth nightly as needed for Sleep. Disp:  Rfl:    Zolpidem Tartrate (AMBIEN PO) Take  by mouth. Disp:  Rfl:        Social History:     Smoking status: Not on file    Smokeless tobacco: Not on file    Alcohol Use: Not on file     [place]    Allergies:  Review of Patient's Allergies indicates:   Ceclor [cefaclor]       Hives    Physical Exam:  Patient Vitals for the past 720 hrs:   Temp Pulse Resp BP SpO2   02/23/14 1153 97.9 F 84 20 133/77 mmHg 100 %       GENERAL:  WDWN, no acute respiratory distress.   SKIN:  Cool on her hands but dry, no rash, no bruising.  Color is slightly pale.  HEENT:  NCAT. PERRL. EOMI. No scleral icterus.  Oropharynx clear.  Mucous membranes moist.  NECK:  No midline C-spine tenderness.  No meningismus.  LUNGS:  Clear to auscultation bilaterally. No wheezes, rales, rhonchi noted.   HEART:  RRR.  No murmur noted.   ABDOMEN:  Soft, normal bowel sounds.  Nontender to palpation throughout.  EXTREMITIES:  No obvious deformities.  Warm and well perfused.  No cyanosis, clubbing, or edema.   GENITOURINARY:  No CVA tenderness.   NEUROLOGIC:  Alert and oriented x3.  CN intact.  MAEW.     PSYCHIATRIC:  Normal affect.  Appropriate for age, time of day, and situation.    ED Course and Medical Decision-making:    The patient is 58 year old  female with near syncope today.  Likely related to dehydration from not eating this morning.  Doubt acute coronary syndrome.  Doubt PE.    Electrocardiogram showed normal sinus rhythm at 79, she has an incomplete right bundle branch block and T wave inversion in aVL.  No ST elevation noted.    Urine was dipped:  Labs Reviewed   POC URINALYSIS - Abnormal; Notable for the following:     KETONE, URINE 15 (*)     All other components within normal limits   BLOOD SUGAR FINGERSTICK (POINT OF CARE)     Patient was given a dose of Zofran ODT.    Orthostatic vital signs were performed.  Patient was not orthostatic by pulse or by blood pressure.  Her symptoms have now resolved.    I think it is safe for her to be discharged home.  She will need to followup with her primary care provider [place]    She will be discharged with a prescription for Zofran.    Reasons to return  to the ED were reviewed in detail. The patient agrees with this plan and disposition.    Disposition:  Discharged to home    Condition on  Discharge:  Improved and Stable      Diagnosis/Diagnoses:  Near syncope  Nausea    Critical care time outside of procedures: 0    Elouise MunroeAdelaide J Lenzie Sandler

## 2014-02-25 LAB — EKG

## 2014-03-30 ENCOUNTER — Ambulatory Visit (INDEPENDENT_AMBULATORY_CARE_PROVIDER_SITE_OTHER): Payer: BC Managed Care – PPO | Admitting: Physical Therapy

## 2014-03-30 DIAGNOSIS — M25569 Pain in unspecified knee: Secondary | ICD-10-CM

## 2014-03-30 DIAGNOSIS — R5381 Other malaise: Secondary | ICD-10-CM

## 2014-03-30 DIAGNOSIS — M2560 Stiffness of unspecified joint, not elsewhere classified: Secondary | ICD-10-CM

## 2014-04-07 DIAGNOSIS — M2560 Stiffness of unspecified joint, not elsewhere classified: Secondary | ICD-10-CM

## 2014-04-07 DIAGNOSIS — M6580 Other synovitis and tenosynovitis, unspecified site: Secondary | ICD-10-CM

## 2014-04-07 DIAGNOSIS — M25569 Pain in unspecified knee: Secondary | ICD-10-CM

## 2014-04-07 DIAGNOSIS — R5381 Other malaise: Secondary | ICD-10-CM

## 2014-04-11 ENCOUNTER — Encounter (INDEPENDENT_AMBULATORY_CARE_PROVIDER_SITE_OTHER): Payer: BC Managed Care – PPO | Admitting: Physical Therapy

## 2014-04-11 DIAGNOSIS — R5381 Other malaise: Secondary | ICD-10-CM

## 2014-04-11 DIAGNOSIS — M6580 Other synovitis and tenosynovitis, unspecified site: Secondary | ICD-10-CM

## 2014-04-11 DIAGNOSIS — M25649 Stiffness of unspecified hand, not elsewhere classified: Secondary | ICD-10-CM

## 2014-04-11 DIAGNOSIS — M25569 Pain in unspecified knee: Secondary | ICD-10-CM

## 2014-04-15 ENCOUNTER — Encounter (INDEPENDENT_AMBULATORY_CARE_PROVIDER_SITE_OTHER): Payer: BC Managed Care – PPO | Admitting: Physical Therapy

## 2014-04-15 DIAGNOSIS — M2560 Stiffness of unspecified joint, not elsewhere classified: Secondary | ICD-10-CM

## 2014-04-15 DIAGNOSIS — M6580 Other synovitis and tenosynovitis, unspecified site: Secondary | ICD-10-CM

## 2014-04-15 DIAGNOSIS — R5381 Other malaise: Secondary | ICD-10-CM

## 2014-04-15 DIAGNOSIS — M25569 Pain in unspecified knee: Secondary | ICD-10-CM

## 2014-04-18 ENCOUNTER — Encounter (INDEPENDENT_AMBULATORY_CARE_PROVIDER_SITE_OTHER): Payer: BC Managed Care – PPO | Admitting: Physical Therapy

## 2014-04-18 DIAGNOSIS — M6580 Other synovitis and tenosynovitis, unspecified site: Secondary | ICD-10-CM

## 2014-04-18 DIAGNOSIS — R5381 Other malaise: Secondary | ICD-10-CM

## 2014-04-18 DIAGNOSIS — M25569 Pain in unspecified knee: Secondary | ICD-10-CM

## 2014-04-18 DIAGNOSIS — M2560 Stiffness of unspecified joint, not elsewhere classified: Secondary | ICD-10-CM

## 2014-04-19 ENCOUNTER — Encounter: Payer: Self-pay | Admitting: Family Medicine

## 2014-04-22 ENCOUNTER — Encounter (INDEPENDENT_AMBULATORY_CARE_PROVIDER_SITE_OTHER): Payer: BC Managed Care – PPO | Admitting: Physical Therapy

## 2014-04-22 DIAGNOSIS — M25569 Pain in unspecified knee: Secondary | ICD-10-CM

## 2014-04-22 DIAGNOSIS — M6580 Other synovitis and tenosynovitis, unspecified site: Secondary | ICD-10-CM

## 2014-04-22 DIAGNOSIS — M2560 Stiffness of unspecified joint, not elsewhere classified: Secondary | ICD-10-CM

## 2014-04-22 DIAGNOSIS — R5381 Other malaise: Secondary | ICD-10-CM

## 2014-04-25 ENCOUNTER — Ambulatory Visit (INDEPENDENT_AMBULATORY_CARE_PROVIDER_SITE_OTHER): Payer: BC Managed Care – PPO | Admitting: Family Medicine

## 2014-04-25 ENCOUNTER — Encounter: Payer: Self-pay | Admitting: Family Medicine

## 2014-04-25 VITALS — BP 124/80 | HR 85 | Temp 98.1°F | Wt 113.0 lb

## 2014-04-25 DIAGNOSIS — M545 Low back pain, unspecified: Secondary | ICD-10-CM

## 2014-04-25 DIAGNOSIS — R140 Abdominal distension (gaseous): Secondary | ICD-10-CM

## 2014-04-25 DIAGNOSIS — R141 Gas pain: Secondary | ICD-10-CM

## 2014-04-25 DIAGNOSIS — R142 Eructation: Secondary | ICD-10-CM

## 2014-04-25 DIAGNOSIS — R143 Flatulence: Secondary | ICD-10-CM

## 2014-04-25 NOTE — Progress Notes (Signed)
Pre visit review using our clinic review tool, if applicable. No additional management support is needed unless otherwise documented below in the visit note. 

## 2014-04-25 NOTE — Progress Notes (Signed)
   Subjective:    Patient ID: Gabrielle Brock, female    DOB: 04/22/1956, 58 y.o.   MRN: [id number]  HPI Patient seen for the following issues  Frequent lower abdominal bloating. Chronic symptoms for several years.  Not related to specific foods. No hx of lactose or gluten intolerance.  She's had problems with increased gas in the past. She's also noticed some difficulties with increased body fat distribution lower abdomen, though her overall weight has been good. She exercises regularly. No change in bowel habits. Colonoscopy up-to-date. No recent weight loss. She has tried various things for gas without much improvement Had pelvic ultrasound last June which showed no major abnormalities.  Recent low back pain. Noted a few days ago after bending over and standing back up. No radiculopathy symptoms. Pain is mostly right lumbar area. No weakness or numbness. No loss of bladder or bowel control. She's had some ongoing right knee pain and has seen orthopedist. She has steroid injection several months ago which did not seem to help. She had MRI scan which did not show any meniscal tear.  Past Medical History  Diagnosis Date  . Hypertension   . Depression   . Osteoporosis   . Hyperlipidemia   . Seasonal allergies   . MVP (mitral valve prolapse) ASYMPTOMATIC  . GERD (gastroesophageal reflux disease)   . History of concussion 20 YRS AGO  . Arthritis   . Chronic headaches    Past Surgical History  Procedure Laterality Date  . Breast biopsy  2006    RIGHT BREAST -  BENIGN  . Strabismus surgery  AGE 29  . Laser retinal repair left eye  20 YRS AGO  . Hysteroscopy w/d&c  11/27/2011    Procedure: DILATATION AND CURETTAGE /HYSTEROSCOPY;  Surgeon: Margarette Asal, MD;  Location: [facility];  Service: Gynecology;  Laterality: N/A;    reports that she has never smoked. She has never used smokeless tobacco. She reports that she drinks alcohol. She reports that she does not use illicit  drugs. family history includes Alzheimer's disease in her mother; Heart disease in her other; Heart disease (age of onset: 27) in her father; Hyperlipidemia in her other; Hypertension in her other; Parkinsonism in her father. Allergies  Allergen Reactions  . Cefaclor Hives    ceclor       Review of Systems  Constitutional: Negative for fever, chills and appetite change.  Respiratory: Negative for cough and shortness of breath.   Cardiovascular: Negative for chest pain.  Gastrointestinal: Positive for abdominal distention. Negative for nausea, vomiting, abdominal pain, diarrhea, constipation and blood in stool.  Musculoskeletal: Positive for back pain.       Objective:   Physical Exam  Constitutional: She appears well-developed and well-nourished.  Cardiovascular: Normal rate and regular rhythm.   Pulmonary/Chest: Effort normal and breath sounds normal. No respiratory distress. She has no wheezes. She has no rales.  Abdominal: Soft. Bowel sounds are normal. She exhibits no distension and no mass. There is no tenderness. There is no rebound and no guarding.  Musculoskeletal: She exhibits no edema.          Assessment & Plan:  #1 low back pain. Nonfocal exam. Reassurance.  Consider Pilates and/or PT if continued symptoms. #2 chronic abdominal bloating and gas. Nonfocal exam. Avoid gas producing foods. Colonoscopy up to date

## 2014-04-27 ENCOUNTER — Encounter (INDEPENDENT_AMBULATORY_CARE_PROVIDER_SITE_OTHER): Payer: BC Managed Care – PPO | Admitting: Physical Therapy

## 2014-04-27 DIAGNOSIS — M25569 Pain in unspecified knee: Secondary | ICD-10-CM

## 2014-04-27 DIAGNOSIS — M2560 Stiffness of unspecified joint, not elsewhere classified: Secondary | ICD-10-CM

## 2014-04-27 DIAGNOSIS — M6580 Other synovitis and tenosynovitis, unspecified site: Secondary | ICD-10-CM

## 2014-04-27 DIAGNOSIS — R5381 Other malaise: Secondary | ICD-10-CM

## 2014-05-01 ENCOUNTER — Encounter: Payer: Self-pay | Admitting: Family Medicine

## 2014-05-02 ENCOUNTER — Telehealth: Payer: Self-pay

## 2014-05-02 NOTE — Telephone Encounter (Signed)
Changed Celebrex to as needed per patient.

## 2014-05-03 ENCOUNTER — Encounter (INDEPENDENT_AMBULATORY_CARE_PROVIDER_SITE_OTHER): Payer: BC Managed Care – PPO | Admitting: Physical Therapy

## 2014-05-03 DIAGNOSIS — M2560 Stiffness of unspecified joint, not elsewhere classified: Secondary | ICD-10-CM

## 2014-05-03 DIAGNOSIS — M6580 Other synovitis and tenosynovitis, unspecified site: Secondary | ICD-10-CM

## 2014-05-03 DIAGNOSIS — M25569 Pain in unspecified knee: Secondary | ICD-10-CM

## 2014-05-03 DIAGNOSIS — R5381 Other malaise: Secondary | ICD-10-CM

## 2014-06-13 ENCOUNTER — Encounter: Payer: Self-pay | Admitting: Family Medicine

## 2014-06-13 ENCOUNTER — Encounter: Payer: Self-pay | Admitting: Gastroenterology

## 2014-06-24 ENCOUNTER — Encounter: Payer: Self-pay | Admitting: Family Medicine

## 2014-06-27 ENCOUNTER — Other Ambulatory Visit: Payer: Self-pay | Admitting: Gastroenterology

## 2014-06-28 ENCOUNTER — Encounter: Payer: Self-pay | Admitting: Family Medicine

## 2014-06-28 ENCOUNTER — Ambulatory Visit (INDEPENDENT_AMBULATORY_CARE_PROVIDER_SITE_OTHER): Payer: BC Managed Care – PPO | Admitting: Family Medicine

## 2014-06-28 VITALS — BP 135/85 | HR 85 | Temp 98.7°F | Ht 62.5 in | Wt 113.0 lb

## 2014-06-28 DIAGNOSIS — G5602 Carpal tunnel syndrome, left upper limb: Secondary | ICD-10-CM

## 2014-06-28 DIAGNOSIS — G5600 Carpal tunnel syndrome, unspecified upper limb: Secondary | ICD-10-CM

## 2014-06-28 NOTE — Telephone Encounter (Signed)
Pt called in and requested appt for Fri am for these issues. appt made.

## 2014-06-28 NOTE — Progress Notes (Signed)
   Subjective:    Patient ID: Gabrielle Brock, female    DOB: [birth year], 58 y.o.   MRN: [id number]  HPI Here for one day of mild swelling and pain on the left forearm, wrist, and thumb. No recent trauma but she thinks this may have been triggered by a BP cuff. She had a colonoscopy yesterday morning, and she had a BP cuff placed on the left forearm throughout the entire procedure. This of course inflated numerous times over the course of several hours. The IV was placed in the right forearm. She has not done anything for the pain so far.    Review of Systems  Constitutional: Negative.   Musculoskeletal: Positive for arthralgias and joint swelling.  Skin: Negative for color change and rash.       Objective:   Physical Exam  Constitutional: She appears well-developed and well-nourished.  Cardiovascular: Intact distal pulses.   Musculoskeletal:  The left forearm has very slight edema from the mid portion down to the wrist, and the left thumb is slightly swollen. No warmth or erythema. She is mildly tender along the radial side of the wrist and the thumb. ROM is full.           Assessment & Plan:  This seems to be a transient type of carpal tunnel syndrome probably related to the presence of a BP cuff on the forearm yesterday. Advised her to apply ice packs to the area and to take Celebrex 200 mg bid for a week or two. She has some of this at home. This should resolve in a few days

## 2014-06-28 NOTE — Progress Notes (Signed)
Pre visit review using our clinic review tool, if applicable. No additional management support is needed unless otherwise documented below in the visit note. 

## 2014-07-01 ENCOUNTER — Ambulatory Visit: Payer: BC Managed Care – PPO | Admitting: Family Medicine

## 2014-07-12 ENCOUNTER — Other Ambulatory Visit: Payer: Self-pay | Admitting: Family Medicine

## 2014-07-12 ENCOUNTER — Encounter: Payer: Self-pay | Admitting: Family Medicine

## 2014-07-13 ENCOUNTER — Other Ambulatory Visit: Payer: Self-pay

## 2014-07-13 MED ORDER — OLMESARTAN MEDOXOMIL 5 MG PO TABS: ORAL_TABLET | ORAL | Status: DC

## 2014-07-19 ENCOUNTER — Encounter: Payer: Self-pay | Admitting: Family Medicine

## 2014-08-01 ENCOUNTER — Encounter: Payer: BC Managed Care – PPO | Admitting: Gastroenterology

## 2014-08-02 ENCOUNTER — Encounter: Payer: Self-pay | Admitting: Family Medicine

## 2014-10-07 ENCOUNTER — Other Ambulatory Visit: Payer: Self-pay | Admitting: Family Medicine

## 2014-10-10 ENCOUNTER — Encounter: Payer: Self-pay | Admitting: Family Medicine

## 2014-10-10 NOTE — Telephone Encounter (Signed)
Last visit 04/25/14 Last refill 01/21/14 #30 5 refill

## 2014-10-10 NOTE — Telephone Encounter (Signed)
Refill for 6 months. 

## 2014-11-29 ENCOUNTER — Encounter: Payer: Self-pay | Admitting: Family Medicine

## 2014-11-29 ENCOUNTER — Ambulatory Visit (INDEPENDENT_AMBULATORY_CARE_PROVIDER_SITE_OTHER): Payer: BLUE CROSS/BLUE SHIELD | Admitting: Family Medicine

## 2014-11-29 VITALS — BP 118/70 | HR 88 | Temp 97.9°F | Wt 112.0 lb

## 2014-11-29 DIAGNOSIS — T700XXA Otitic barotrauma, initial encounter: Secondary | ICD-10-CM

## 2014-11-29 NOTE — Patient Instructions (Signed)
Barotitis Media Barotitis media is inflammation of your middle ear. This occurs when the auditory tube (eustachian tube) leading from the back of your nose (nasopharynx) to your eardrum is blocked. This blockage may result from a cold, environmental allergies, or an upper respiratory infection. Unresolved barotitis media may lead to damage or hearing loss (barotrauma), which may become permanent. HOME CARE INSTRUCTIONS   Use medicines as recommended by your health care provider. Over-the-counter medicines will help unblock the canal and can help during times of air travel.  Do not put anything into your ears to clean or unplug them. Eardrops will not be helpful.  Do not swim, dive, or fly until your health care provider says it is all right to do so. If these activities are necessary, chewing gum with frequent, forceful swallowing may help. It is also helpful to hold your nose and gently blow to pop your ears for equalizing pressure changes. This forces air into the eustachian tube.  Only take over-the-counter or prescription medicines for pain, discomfort, or fever as directed by your health care provider.  A decongestant may be helpful in decongesting the middle ear and make pressure equalization easier. SEEK MEDICAL CARE IF:  You experience a serious form of dizziness in which you feel as if the room is spinning and you feel nauseated (vertigo).  Your symptoms only involve one ear. SEEK IMMEDIATE MEDICAL CARE IF:   You develop a severe headache, dizziness, or severe ear pain.  You have bloody or pus-like drainage from your ears.  You develop a fever.  Your problems do not improve or become worse. MAKE SURE YOU:   Understand these instructions.  Will watch your condition.  Will get help right away if you are not doing well or get worse. Document Released: 10/11/2000 Document Revised: 08/04/2013 Document Reviewed: 05/11/2013 [employer] Patient Information 2015 [employer]. This  information is not intended to replace advice given to you by your health care provider. Make sure you discuss any questions you have with your health care provider.  

## 2014-11-29 NOTE — Progress Notes (Signed)
   Subjective:    Patient ID: Gabrielle Brock, female    DOB: Mar 14, 1956, 59 y.o.   MRN: [id number]  HPI Patient seen with some fullness in her right ear. 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 She has not had any vertigo. She had a bit of fullness and possibly slightly diminished right ear symptoms last Friday but back to normal today. She denies any ringing in the ears. Denies any nasal congestion other than cold couple weeks ago. No ear drainage.  Also has had some recent issues with right Achilles soreness. She saw orthopedist. No evidence rupture. She was treated with prednisone and then subsequent Celebrex. She has some tenderness along the distal Achilles region. She initially had some swelling which is since resolved. No weakness  Past Medical History  Diagnosis Date  . Hypertension   . Depression   . Osteoporosis   . Hyperlipidemia   . Seasonal allergies   . MVP (mitral valve prolapse) ASYMPTOMATIC  . GERD (gastroesophageal reflux disease)   . History of concussion 20 YRS AGO  . Arthritis   . Chronic headaches    Past Surgical History  Procedure Laterality Date  . Breast biopsy  2006    RIGHT BREAST -  BENIGN  . Strabismus surgery  AGE 22  . Laser retinal repair left eye  20 YRS AGO  . Hysteroscopy w/d&c  11/27/2011    Procedure: DILATATION AND CURETTAGE /HYSTEROSCOPY;  Surgeon: Margarette Asal, MD;  Location: [facility];  Service: Gynecology;  Laterality: N/A;    reports that she has never smoked. She has never used smokeless tobacco. She reports that she drinks alcohol. She reports that she does not use illicit drugs. family history includes Alzheimer's disease in her mother; Heart disease in her other; Heart disease (age of onset: 101) in her father; Hyperlipidemia in her other; Hypertension in her other; Parkinsonism in her father. Allergies  Allergen Reactions  . Advil [Ibuprofen]     Lips swell  . Cefaclor Hives    ceclor        Review of Systems  Constitutional: Negative for fever and chills.  HENT: Negative for congestion, ear discharge and ear pain.   Respiratory: Negative for cough and shortness of breath.   Neurological: Negative for dizziness, weakness and headaches.       Objective:   Physical Exam  Constitutional: She appears well-developed and well-nourished.  HENT:  Right Ear: External ear normal.  Left Ear: External ear normal.  Mouth/Throat: Oropharynx is clear and moist.  Neck: Neck supple.  Cardiovascular: Normal rate and regular rhythm.   Pulmonary/Chest: Effort normal and breath sounds normal. No respiratory distress. She has no wheezes. She has no rales.  Lymphadenopathy:    She has no cervical adenopathy.          Assessment & Plan:  Probable recent right barotitis media. She has chronic hearing loss left ear since childhood. Ear exam is totally normal and her symptoms are resolved at this time. She had been flying some recently which may be related. We've recommend observation at this time. Follow-up promptly for any recurrence or new symptoms such as ringing

## 2014-11-29 NOTE — Progress Notes (Signed)
Pre visit review using our clinic review tool, if applicable. No additional management support is needed unless otherwise documented below in the visit note. 

## 2015-02-10 ENCOUNTER — Encounter: Payer: Self-pay | Admitting: Family Medicine

## 2015-03-21 ENCOUNTER — Encounter: Payer: Self-pay | Admitting: Family Medicine

## 2015-03-21 ENCOUNTER — Telehealth: Payer: Self-pay | Admitting: Family Medicine

## 2015-03-21 ENCOUNTER — Ambulatory Visit (INDEPENDENT_AMBULATORY_CARE_PROVIDER_SITE_OTHER): Payer: BLUE CROSS/BLUE SHIELD | Admitting: Family Medicine

## 2015-03-21 ENCOUNTER — Other Ambulatory Visit: Payer: Self-pay | Admitting: Family Medicine

## 2015-03-21 VITALS — BP 128/78 | HR 84 | Temp 97.7°F | Wt 113.0 lb

## 2015-03-21 DIAGNOSIS — R140 Abdominal distension (gaseous): Secondary | ICD-10-CM

## 2015-03-21 DIAGNOSIS — Z0000 Encounter for general adult medical examination without abnormal findings: Secondary | ICD-10-CM

## 2015-03-21 NOTE — Progress Notes (Signed)
   Subjective:    Patient ID: Gabrielle Brock, female    DOB: 1956/02/10, 59 y.o.   MRN: [id number]  HPI  Seen with complaint which has actually been going on for several years. She has some diffuse abdominal bloating. She unfortunately lost her job recently and has some stress issues with that. Her bloating is fairly diffuse and she noticed increased gas. She had colonoscopy August 2015 with benign polyps but no other acute abnormalities. No recent appetite or weight changes. Still exercising regularly. No change in bowel habits. She discussed gas issues with gastrologist and he placed her on some type of specific diet which did not seem to help. She has tried Lactaid without improvement. No diarrhea. No localizing abdominal pain. Previous pelvic ultrasounds have been unremarkable.  She has no adnexal pain.  Past Medical History  Diagnosis Date  . Hypertension   . Depression   . Osteoporosis   . Hyperlipidemia   . Seasonal allergies   . MVP (mitral valve prolapse) ASYMPTOMATIC  . GERD (gastroesophageal reflux disease)   . History of concussion 20 YRS AGO  . Arthritis   . Chronic headaches    Past Surgical History  Procedure Laterality Date  . Breast biopsy  2006    RIGHT BREAST -  BENIGN  . Strabismus surgery  AGE 60  . Laser retinal repair left eye  20 YRS AGO  . Hysteroscopy w/d&c  11/27/2011    Procedure: DILATATION AND CURETTAGE /HYSTEROSCOPY;  Surgeon: Margarette Asal, MD;  Location: [facility];  Service: Gynecology;  Laterality: N/A;    reports that she has never smoked. She has never used smokeless tobacco. She reports that she drinks alcohol. She reports that she does not use illicit drugs. family history includes Alzheimer's disease in her mother; Heart disease in her other; Heart disease (age of onset: 28) in her father; Hyperlipidemia in her other; Hypertension in her other; Parkinsonism in her father. Allergies  Allergen Reactions  . Advil [Ibuprofen]    Lips swell  . Cefaclor Hives    ceclor     Review of Systems  Constitutional: Negative for fever, chills, appetite change and unexpected weight change.  Respiratory: Negative for cough and shortness of breath.   Cardiovascular: Negative for chest pain.  Gastrointestinal: Positive for abdominal distention. Negative for nausea, vomiting, diarrhea, constipation and blood in stool.       Objective:   Physical Exam  Constitutional: She appears well-developed and well-nourished.  Cardiovascular: Normal rate and regular rhythm.   Pulmonary/Chest: Effort normal and breath sounds normal. No respiratory distress. She has no wheezes. She has no rales.  Abdominal: Soft. Bowel sounds are normal. She exhibits no distension and no mass. There is no tenderness. There is no rebound and no guarding.          Assessment & Plan:  Diffuse abdominal bloating. This has been a chronic problem for her for many years. We gave her handout and discussed factors that produced gas also some over-the-counter alleviating things to try Continue regular exercise. Schedule physical. Follow-up promptly for any appetite or weight changes

## 2015-03-21 NOTE — Telephone Encounter (Signed)
Pt want a order for labs at [facility].

## 2015-03-21 NOTE — Telephone Encounter (Signed)
Labs are ordered 

## 2015-03-21 NOTE — Progress Notes (Signed)
Pre visit review using our clinic review tool, if applicable. No additional management support is needed unless otherwise documented below in the visit note. 

## 2015-03-22 ENCOUNTER — Other Ambulatory Visit: Payer: Self-pay | Admitting: Family Medicine

## 2015-03-22 DIAGNOSIS — Z0000 Encounter for general adult medical examination without abnormal findings: Secondary | ICD-10-CM

## 2015-03-24 ENCOUNTER — Encounter: Payer: Self-pay | Admitting: Family Medicine

## 2015-03-24 ENCOUNTER — Other Ambulatory Visit (INDEPENDENT_AMBULATORY_CARE_PROVIDER_SITE_OTHER): Payer: BLUE CROSS/BLUE SHIELD

## 2015-03-24 ENCOUNTER — Other Ambulatory Visit: Payer: Self-pay | Admitting: Family Medicine

## 2015-03-24 DIAGNOSIS — R8290 Unspecified abnormal findings in urine: Secondary | ICD-10-CM

## 2015-03-24 DIAGNOSIS — Z0000 Encounter for general adult medical examination without abnormal findings: Secondary | ICD-10-CM | POA: Diagnosis not present

## 2015-03-24 LAB — BASIC METABOLIC PANEL
BUN: 16 mg/dL (ref 6–23)
CHLORIDE: 103 meq/L (ref 96–112)
CO2: 28 meq/L (ref 19–32)
CREATININE: 0.81 mg/dL (ref 0.40–1.20)
Calcium: 9 mg/dL (ref 8.4–10.5)
GFR: 77.44 mL/min (ref >60.00)
Glucose, Bld: 87 mg/dL (ref 70–99)
Potassium: 3.9 mEq/L (ref 3.5–5.1)
Sodium: 137 mEq/L (ref 135–145)

## 2015-03-24 LAB — CBC WITH DIFFERENTIAL/PLATELET
BASOS ABS: 0 10*3/uL (ref 0.0–0.1)
Basophils Relative: 0.5 % (ref 0.0–3.0)
EOS ABS: 0.7 10*3/uL (ref 0.0–0.7)
Eosinophils Relative: 7.8 % — ABNORMAL HIGH (ref 0.0–5.0)
HCT: 36.3 % (ref 36.0–46.0)
Hemoglobin: 12.1 g/dL (ref 12.0–15.0)
LYMPHS ABS: 2.9 10*3/uL (ref 0.7–4.0)
Lymphocytes Relative: 31.9 % (ref 12.0–46.0)
MCHC: 33.3 g/dL (ref 30.0–36.0)
MCV: 89.4 fl (ref 78.0–100.0)
Monocytes Absolute: 0.6 10*3/uL (ref 0.1–1.0)
Monocytes Relative: 6.8 % (ref 3.0–12.0)
Neutro Abs: 4.8 10*3/uL (ref 1.4–7.7)
Neutrophils Relative %: 53 % (ref 43.0–77.0)
Platelets: 269 10*3/uL (ref 150.0–400.0)
RBC: 4.05 Mil/uL (ref 3.87–5.11)
RDW: 14.2 % (ref 11.5–15.5)
WBC: 9 10*3/uL (ref 4.0–10.5)

## 2015-03-24 LAB — URINALYSIS
BILIRUBIN URINE: NEGATIVE
HGB URINE DIPSTICK: NEGATIVE
Ketones, ur: NEGATIVE
Leukocytes, UA: NEGATIVE
Nitrite: NEGATIVE
PH: 6 (ref 5.0–8.0)
SPECIFIC GRAVITY, URINE: 1.025 (ref 1.000–1.030)
Total Protein, Urine: NEGATIVE
URINE GLUCOSE: NEGATIVE
Urobilinogen, UA: 0.2 (ref 0.0–1.0)

## 2015-03-24 LAB — LIPID PANEL
Cholesterol: 205 mg/dL — ABNORMAL HIGH (ref 0–200)
HDL: 66.4 mg/dL (ref >39.00)
LDL Cholesterol: 127 mg/dL — ABNORMAL HIGH (ref 0–99)
NonHDL: 138.6
Total CHOL/HDL Ratio: 3
Triglycerides: 56 mg/dL (ref 0.0–149.0)
VLDL: 11.2 mg/dL (ref 0.0–40.0)

## 2015-03-24 LAB — HEPATIC FUNCTION PANEL
ALBUMIN: 4.1 g/dL (ref 3.5–5.2)
ALK PHOS: 76 U/L (ref 39–117)
ALT: 15 U/L (ref 0–35)
AST: 20 U/L (ref 0–37)
Bilirubin, Direct: 0.1 mg/dL (ref 0.0–0.3)
Total Bilirubin: 0.4 mg/dL (ref 0.2–1.2)
Total Protein: 6.8 g/dL (ref 6.0–8.3)

## 2015-03-24 LAB — TSH: TSH: 2.04 u[IU]/mL (ref 0.35–4.50)

## 2015-03-28 LAB — CELIAC PANEL 10
ENDOMYSIAL SCREEN: NEGATIVE
GLIADIN IGA: 3 U (ref <20)
GLIADIN IGG: 1 U (ref <20)
IgA: 198 mg/dL (ref 69–380)
TISSUE TRANSGLUT AB: 1 U/mL (ref <6)
TISSUE TRANSGLUTAMINASE AB, IGA: 1 U/mL (ref <4)

## 2015-03-29 ENCOUNTER — Encounter: Payer: Self-pay | Admitting: Family Medicine

## 2015-03-30 ENCOUNTER — Telehealth: Payer: Self-pay | Admitting: *Deleted

## 2015-03-30 NOTE — Telephone Encounter (Signed)
Mychart message sent.

## 2015-05-09 ENCOUNTER — Other Ambulatory Visit: Payer: Self-pay

## 2015-05-09 DIAGNOSIS — Z1231 Encounter for screening mammogram for malignant neoplasm of breast: Secondary | ICD-10-CM

## 2015-05-12 ENCOUNTER — Encounter: Payer: Self-pay | Admitting: Family Medicine

## 2015-05-12 ENCOUNTER — Ambulatory Visit
Admission: RE | Admit: 2015-05-12 | Discharge: 2015-05-12 | Disposition: A | Discharge status: Home / Self Care | Payer: BLUE CROSS/BLUE SHIELD | Source: Ambulatory Visit

## 2015-05-12 DIAGNOSIS — Z1231 Encounter for screening mammogram for malignant neoplasm of breast: Secondary | ICD-10-CM

## 2015-05-19 ENCOUNTER — Encounter: Payer: Self-pay | Admitting: Family Medicine

## 2015-05-22 ENCOUNTER — Encounter: Payer: Self-pay | Admitting: Family Medicine

## 2015-05-23 ENCOUNTER — Ambulatory Visit (INDEPENDENT_AMBULATORY_CARE_PROVIDER_SITE_OTHER): Payer: BLUE CROSS/BLUE SHIELD | Admitting: Family Medicine

## 2015-05-23 ENCOUNTER — Encounter: Payer: Self-pay | Admitting: Family Medicine

## 2015-05-23 VITALS — BP 130/80 | HR 94 | Temp 98.0°F | Wt 112.0 lb

## 2015-05-23 DIAGNOSIS — I10 Essential (primary) hypertension: Secondary | ICD-10-CM

## 2015-05-23 DIAGNOSIS — Z79899 Other long term (current) drug therapy: Secondary | ICD-10-CM

## 2015-05-23 NOTE — Progress Notes (Signed)
   Subjective:    Patient ID: Gabrielle Brock, female    DOB: 10-Aug-1956, 59 y.o.   MRN: [id number]  HPI  Patient here to discuss blood pressure. On very low-dose Benicar 5 mg but recently had several blood pressures low 539J systolic and 67H diastolic. She's felt lightheaded several times but no syncope. Exercises regularly-and no recent exercise intolerance. . Also takes maprotiline per psychiatry elsewhere and has had previous EKGs which have not shown significant conduction issues. 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 No palpitations. No chest pains. No dyspnea with exertion.    Past Medical History  Diagnosis Date  . Hypertension   . Depression   . Osteoporosis   . Hyperlipidemia   . Seasonal allergies   . MVP (mitral valve prolapse) ASYMPTOMATIC  . GERD (gastroesophageal reflux disease)   . History of concussion 20 YRS AGO  . Arthritis   . Chronic headaches    Past Surgical History  Procedure Laterality Date  . Breast biopsy  2006    RIGHT BREAST -  BENIGN  . Strabismus surgery  AGE 89  . Laser retinal repair left eye  20 YRS AGO  . Hysteroscopy w/d&c  11/27/2011    Procedure: DILATATION AND CURETTAGE /HYSTEROSCOPY;  Surgeon: Margarette Asal, MD;  Location: [facility];  Service: Gynecology;  Laterality: N/A;    reports that she has never smoked. She has never used smokeless tobacco. She reports that she drinks alcohol. She reports that she does not use illicit drugs. family history includes Alzheimer's disease in her mother; Heart disease in her other; Heart disease (age of onset: 79) in her father; Hyperlipidemia in her other; Hypertension in her other; Parkinsonism in her father. Allergies  Allergen Reactions  . Advil [Ibuprofen]     Lips swell  . Cefaclor Hives    ceclor     Review of Systems  Constitutional: Negative for fatigue.  Eyes: Negative for visual disturbance.  Respiratory: Negative for cough, chest tightness,  shortness of breath and wheezing.   Cardiovascular: Negative for chest pain, palpitations and leg swelling.  Neurological: Positive for dizziness. Negative for seizures, syncope, weakness, light-headedness and headaches.       Objective:   Physical Exam  Constitutional: She is oriented to person, place, and time. She appears well-developed and well-nourished.  Cardiovascular: Normal rate and regular rhythm.  Exam reveals no gallop.   No murmur heard. Pulmonary/Chest: Effort normal and breath sounds normal. No respiratory distress. She has no wheezes. She has no rales.  Musculoskeletal: She exhibits no edema.  Neurological: She is alert and oriented to person, place, and time.          Assessment & Plan:  Hypertension. Recent hypotensive episodes. Discontinue Benicar. Monitor blood pressure closely and be in touch if consistently greater than 140/90. Repeat EKG today shows sinus rhythm with incomplete right bundle-branch block. No acute changes.

## 2015-05-23 NOTE — Progress Notes (Signed)
Pre visit review using our clinic review tool, if applicable. No additional management support is needed unless otherwise documented below in the visit note. 

## 2015-05-23 NOTE — Patient Instructions (Signed)
Hold Benicar and monitor blood pressure.  Be in touch if consistently > 140/90

## 2015-07-19 ENCOUNTER — Encounter: Payer: Self-pay | Admitting: Family Medicine

## 2015-07-20 ENCOUNTER — Encounter: Payer: Self-pay | Admitting: Family Medicine

## 2015-07-21 ENCOUNTER — Encounter: Payer: Self-pay | Admitting: Family Medicine

## 2015-07-21 ENCOUNTER — Ambulatory Visit (INDEPENDENT_AMBULATORY_CARE_PROVIDER_SITE_OTHER): Payer: BLUE CROSS/BLUE SHIELD | Admitting: Family Medicine

## 2015-07-21 VITALS — BP 140/84 | HR 82 | Temp 98.0°F | Wt 113.2 lb

## 2015-07-21 DIAGNOSIS — R140 Abdominal distension (gaseous): Secondary | ICD-10-CM

## 2015-07-21 NOTE — Progress Notes (Signed)
   Subjective:    Patient ID: Gabrielle Brock, female    DOB: [birth year], 59 y.o.   MRN: [id number]  HPI  Patient here to discuss diffuse abdominal "bloating". She's had similar complaints for several years. She is seeing gastroenterologist recently and was recently placed on Creon but states that she felt a "hot" sensation upper abdominal region after starting this couple days ago. She has not had any nausea or vomiting. No stool changes. She does have history of some reflux in the past and takes Nexium. She also saw her gynecologist recently and they have scheduled pelvic ultrasound next week. She has been screened previously for celiac disease and negative. No history of lactose intolerance. Patient questions whether her symptoms above were triggered by the Creon. Her appetite is stable. Weight is stable. Denies any recent nausea or vomiting.  Past Medical History  Diagnosis Date  . Hypertension   . Depression   . Osteoporosis   . Hyperlipidemia   . Seasonal allergies   . MVP (mitral valve prolapse) ASYMPTOMATIC  . GERD (gastroesophageal reflux disease)   . History of concussion 20 YRS AGO  . Arthritis   . Chronic headaches    Past Surgical History  Procedure Laterality Date  . Breast biopsy  2006    RIGHT BREAST -  BENIGN  . Strabismus surgery  AGE 5  . Laser retinal repair left eye  20 YRS AGO  . Hysteroscopy w/d&c  11/27/2011    Procedure: DILATATION AND CURETTAGE /HYSTEROSCOPY;  Surgeon: Margarette Asal, MD;  Location: [facility];  Service: Gynecology;  Laterality: N/A;    reports that she has never smoked. She has never used smokeless tobacco. She reports that she drinks alcohol. She reports that she does not use illicit drugs. family history includes Alzheimer's disease in her mother; Heart disease in her other; Heart disease (age of onset: 54) in her father; Hyperlipidemia in her other; Hypertension in her other; Parkinsonism in her father. Allergies  Allergen  Reactions  . Advil [Ibuprofen]     Lips swell  . Cefaclor Hives    ceclor     Review of Systems  Constitutional: Negative for fever, chills, appetite change and unexpected weight change.  Respiratory: Negative for shortness of breath.   Gastrointestinal: Positive for abdominal pain and abdominal distention. Negative for nausea, vomiting, diarrhea, constipation and blood in stool.  Genitourinary: Negative for dysuria.       Objective:   Physical Exam  Constitutional: She appears well-developed and well-nourished.  Cardiovascular: Normal rate and regular rhythm.   Pulmonary/Chest: Effort normal and breath sounds normal. No respiratory distress. She has no wheezes. She has no rales.  Abdominal: Soft. Bowel sounds are normal. She exhibits no mass. There is no tenderness. There is no rebound and no guarding.          Assessment & Plan:  Abdominal bloating. This has been a chronic symptom for her. Differential is IBS, increased gas/flatulence, doubt bacterial overgrowth syndrome. We'll encourage her to continue follow-up with GI who is working with her currently. They have consider possibility of Xifaxan if not responding to Creon. She does not have any red flags such as fever, weight loss, stool changes.

## 2015-07-21 NOTE — Progress Notes (Signed)
Pre visit review using our clinic review tool, if applicable. No additional management support is needed unless otherwise documented below in the visit note. 

## 2015-07-21 NOTE — Patient Instructions (Signed)
If abdominal burning persists consider increase Nexium to twice daily

## 2015-07-27 ENCOUNTER — Encounter: Payer: Self-pay | Admitting: Family Medicine

## 2015-07-27 DIAGNOSIS — R232 Flushing: Secondary | ICD-10-CM

## 2015-08-03 ENCOUNTER — Other Ambulatory Visit: Payer: Self-pay | Admitting: Gastroenterology

## 2015-08-03 DIAGNOSIS — R140 Abdominal distension (gaseous): Secondary | ICD-10-CM

## 2015-08-07 ENCOUNTER — Encounter: Payer: Self-pay | Admitting: Family Medicine

## 2015-08-07 ENCOUNTER — Ambulatory Visit
Admission: RE | Admit: 2015-08-07 | Discharge: 2015-08-07 | Disposition: A | Discharge status: Home / Self Care | Payer: BLUE CROSS/BLUE SHIELD | Source: Ambulatory Visit | Attending: Gastroenterology | Admitting: Gastroenterology

## 2015-08-07 DIAGNOSIS — R140 Abdominal distension (gaseous): Secondary | ICD-10-CM

## 2015-08-10 ENCOUNTER — Encounter: Payer: Self-pay | Admitting: Family Medicine

## 2015-09-01 ENCOUNTER — Encounter: Payer: Self-pay | Admitting: Family Medicine

## 2015-09-07 ENCOUNTER — Encounter: Payer: Self-pay | Admitting: Family Medicine

## 2015-09-08 ENCOUNTER — Ambulatory Visit: Payer: BLUE CROSS/BLUE SHIELD | Admitting: Family Medicine

## 2015-10-08 ENCOUNTER — Encounter: Payer: Self-pay | Admitting: Family Medicine

## 2015-10-09 ENCOUNTER — Other Ambulatory Visit: Payer: Self-pay | Admitting: Family Medicine

## 2015-10-09 MED ORDER — OLMESARTAN MEDOXOMIL 5 MG PO TABS: ORAL_TABLET | ORAL | Status: DC

## 2015-10-09 MED ORDER — ZOLPIDEM TARTRATE 5 MG PO TABS: 5.0000 mg | ORAL_TABLET | Freq: Every evening | ORAL | Status: DC | PRN

## 2015-11-11 ENCOUNTER — Encounter: Payer: Self-pay | Admitting: Internal Medicine

## 2015-11-11 ENCOUNTER — Ambulatory Visit (INDEPENDENT_AMBULATORY_CARE_PROVIDER_SITE_OTHER): Payer: BLUE CROSS/BLUE SHIELD | Admitting: Internal Medicine

## 2015-11-11 VITALS — BP 126/84 | HR 98 | Temp 97.9°F | Resp 16 | Wt 111.0 lb

## 2015-11-11 DIAGNOSIS — R062 Wheezing: Secondary | ICD-10-CM | POA: Diagnosis not present

## 2015-11-11 DIAGNOSIS — R05 Cough: Secondary | ICD-10-CM | POA: Diagnosis not present

## 2015-11-11 DIAGNOSIS — I10 Essential (primary) hypertension: Secondary | ICD-10-CM | POA: Diagnosis not present

## 2015-11-11 DIAGNOSIS — R059 Cough, unspecified: Secondary | ICD-10-CM | POA: Insufficient documentation

## 2015-11-11 MED ORDER — AZITHROMYCIN 250 MG PO TABS: ORAL_TABLET | ORAL | Status: DC

## 2015-11-11 MED ORDER — METHYLPREDNISOLONE 4 MG PO TBPK: ORAL_TABLET | ORAL | Status: DC

## 2015-11-11 NOTE — Assessment & Plan Note (Signed)
Mild, for medrol pak course,  to f/u any worsening symptoms or concerns, declines cough med

## 2015-11-11 NOTE — Progress Notes (Signed)
Pre visit review using our clinic review tool, if applicable. No additional management support is needed unless otherwise documented below in the visit note. 

## 2015-11-11 NOTE — Assessment & Plan Note (Signed)
stable overall by history and exam, recent data reviewed with pt, and pt to continue medical treatment as before,  to f/u any worsening symptoms or concerns BP Readings from Last 3 Encounters:  11/11/15 126/84  07/21/15 140/84  05/23/15 130/80   OK for mucinex with hx of HTN, pt reassured

## 2015-11-11 NOTE — Assessment & Plan Note (Signed)
Mild to mod, c/w bronchitis vs pna, declines cxr, for antibx course, to f/u any worsening symptoms or concerns  

## 2015-11-11 NOTE — Patient Instructions (Signed)
Please take all new medication as prescribed - the antibiotic, and medrol dose pack  You can also take Delsym OTC for cough, and/or Mucinex (or it's generic off brand) for congestion, and tylenol as needed for pain.  Please continue all other medications as before, and refills have been done if requested.  Please have the pharmacy call with any other refills you may need.  Please keep your appointments with your specialists as you may have planned

## 2015-11-11 NOTE — Progress Notes (Signed)
Subjective:    Patient ID: Gabrielle Brock, female    DOB: 1956/10/27, 60 y.o.   MRN: [id number]  HPI  Here with acute onset mild to mod 2-3 days ST, HA, general weakness and malaise, with prod cough greenish sputum, but Pt denies chest pain, increased sob or doe, wheezing, orthopnea, PND, increased LE swelling, palpitations, dizziness or syncope, except for onset mild wheezing last PM, without significant DOE.  Pt denies new neurological symptoms such as new headache, or facial or extremity weakness or numbness   Pt denies polydipsia, polyuria,   Past Medical History  Diagnosis Date  . Hypertension   . Depression   . Osteoporosis   . Hyperlipidemia   . Seasonal allergies   . MVP (mitral valve prolapse) ASYMPTOMATIC  . GERD (gastroesophageal reflux disease)   . History of concussion 20 YRS AGO  . Arthritis   . Chronic headaches    Past Surgical History  Procedure Laterality Date  . Breast biopsy  2006    RIGHT BREAST -  BENIGN  . Strabismus surgery  AGE 27  . Laser retinal repair left eye  20 YRS AGO  . Hysteroscopy w/d&c  11/27/2011    Procedure: DILATATION AND CURETTAGE /HYSTEROSCOPY;  Surgeon: Margarette Asal, MD;  Location: [facility];  Service: Gynecology;  Laterality: N/A;    reports that she has never smoked. She has never used smokeless tobacco. She reports that she drinks alcohol. She reports that she does not use illicit drugs. family history includes Alzheimer's disease in her mother; Heart disease in her other; Heart disease (age of onset: 54) in her father; Hyperlipidemia in her other; Hypertension in her other; Parkinsonism in her father. Allergies  Allergen Reactions  . Advil [Ibuprofen]     Lips swell  . Cefaclor Hives    ceclor   Current Outpatient Prescriptions on File Prior to Visit  Medication Sig Dispense Refill  . ALPRAZolam (XANAX) 0.25 MG tablet Take 0.75 mg by mouth at bedtime as needed. 2 and 1/2 at bedtime    . azelastine (ASTELIN) 137  MCG/SPRAY nasal spray USE 1 SPRAY IN EACH NOSTRIL TWICE DAILY AS NEEDED AS DIRECTED 30 mL 3  . BIOTIN PO Take 1 tablet by mouth daily.    . butalbital-acetaminophen-caffeine (FIORICET, ESGIC) 50-325-40 MG per tablet Take 1 tablet by mouth 2 (two) times daily as needed.     . celecoxib (CELEBREX) 200 MG capsule Take 200 mg by mouth as needed.    . cholecalciferol (VITAMIN D) 1000 UNITS tablet Take 1,000 Units by mouth daily.    Marland Kitchen esomeprazole (NEXIUM) 20 MG capsule Take 20 mg by mouth daily at 12 noon.    . maprotiline (LUDIOMIL) 75 MG tablet Take 75 mg by mouth at bedtime.     . Multiple Vitamins-Minerals (MULTIPLE VITAMINS/WOMENS PO) Take by mouth daily.    Marland Kitchen olmesartan (BENICAR) 5 MG tablet TAKE 1 TABLET DAILY 90 tablet 3  . zolpidem (AMBIEN) 5 MG tablet Take 1 tablet (5 mg total) by mouth at bedtime as needed. for sleep 90 tablet 1   No current facility-administered medications on file prior to visit.   Review of Systems  Constitutional: Negative for unusual diaphoresis or night sweats HENT: Negative for ringing in ear or discharge Eyes: Negative for double vision or worsening visual disturbance.  Respiratory: Negative for choking and stridor.   Gastrointestinal: Negative for vomiting or other signifcant bowel change Genitourinary: Negative for hematuria or change in urine volume.  Musculoskeletal: Negative for other MSK pain or swelling Skin: Negative for color change and worsening wound.  Neurological: Negative for tremors and numbness other than noted  Psychiatric/Behavioral: Negative for decreased concentration or agitation other than above       Objective:   Physical Exam BP 126/84 mmHg  Pulse 98  Temp(Src) 97.9 F (36.6 C) (Oral)  Resp 16  Wt 111 lb (50.349 kg)  SpO2 97%  LMP 11/30/2011 VS noted, mild ill  Constitutional: Pt appears in no significant distress but tense demeanor HENT: Head: NCAT.  Right Ear: External ear normal.  Left Ear: External ear normal.  Bilat  tm's with mild erythema.  Max sinus areas non tender.  Pharynx with mild erythema, no exudate Eyes: . Pupils are equal, round, and reactive to light. Conjunctivae and EOM are normal Neck: Normal range of motion. Neck supple. with bilat submandib LA Cardiovascular: Normal rate and regular rhythm.   Pulmonary/Chest: Effort normal and breath sounds decreased without rales but mild bilat wheezing.  Neurological: Pt is alert. Not confused , motor grossly intact Skin: Skin is warm. No rash, no LE edema Psychiatric: Pt behavior is normal. No agitation. 1-2+ nervous    Assessment & Plan:

## 2015-11-12 ENCOUNTER — Encounter: Payer: Self-pay | Admitting: Family Medicine

## 2015-11-13 NOTE — Telephone Encounter (Signed)
Please advise if pt needs to have ears rechecked?

## 2015-11-13 NOTE — Telephone Encounter (Signed)
Notified patient & told her of Dr. Erick Blinks comments. Patient verbalized understanding. She states that she will call back to schedule an appt if needed. Thanks.

## 2015-11-16 ENCOUNTER — Ambulatory Visit: Payer: BLUE CROSS/BLUE SHIELD | Admitting: Family Medicine

## 2015-11-16 ENCOUNTER — Ambulatory Visit (INDEPENDENT_AMBULATORY_CARE_PROVIDER_SITE_OTHER): Payer: BLUE CROSS/BLUE SHIELD | Admitting: Family Medicine

## 2015-11-16 VITALS — BP 142/90 | HR 100 | Temp 98.1°F | Ht 62.5 in | Wt 109.9 lb

## 2015-11-16 DIAGNOSIS — J209 Acute bronchitis, unspecified: Secondary | ICD-10-CM | POA: Diagnosis not present

## 2015-11-16 NOTE — Telephone Encounter (Signed)
Pt has a pending appt today.

## 2015-11-16 NOTE — Patient Instructions (Signed)

## 2015-11-16 NOTE — Progress Notes (Signed)
   Subjective:    Patient ID: Gabrielle Brock, female    DOB: [birth year], 58 y.o.   MRN: [id number]  HPI Patient seen for follow-up recent bronchial illness. She was seen in Saturday clinic and noted to have some wheezing. She has no history of asthma. She had productive cough. She was placed on Medrol dose taper and Zithromax. She is overall much better. She has resumed exercise with swimming. Still has occasional productive cough. No fevers or chills. Nonsmoker. Using Mucinex occasionally  Past Medical History  Diagnosis Date  . Hypertension   . Depression   . Osteoporosis   . Hyperlipidemia   . Seasonal allergies   . MVP (mitral valve prolapse) ASYMPTOMATIC  . GERD (gastroesophageal reflux disease)   . History of concussion 20 YRS AGO  . Arthritis   . Chronic headaches    Past Surgical History  Procedure Laterality Date  . Breast biopsy  2006    RIGHT BREAST -  BENIGN  . Strabismus surgery  AGE 40  . Laser retinal repair left eye  20 YRS AGO  . Hysteroscopy w/d&c  11/27/2011    Procedure: DILATATION AND CURETTAGE /HYSTEROSCOPY;  Surgeon: Margarette Asal, MD;  Location: [facility];  Service: Gynecology;  Laterality: N/A;    reports that she has never smoked. She has never used smokeless tobacco. She reports that she drinks alcohol. She reports that she does not use illicit drugs. family history includes Alzheimer's disease in her mother; Heart disease in her other; Heart disease (age of onset: 19) in her father; Hyperlipidemia in her other; Hypertension in her other; Parkinsonism in her father. Allergies  Allergen Reactions  . Advil [Ibuprofen]     Lips swell  . Cefaclor Hives    ceclor      Review of Systems  Constitutional: Negative for fever and chills.  HENT: Negative for congestion and sore throat.   Respiratory: Positive for cough. Negative for shortness of breath and wheezing.        Objective:   Physical Exam  Constitutional: She appears  well-developed and well-nourished.  HENT:  Right Ear: External ear normal.  Left Ear: External ear normal.  She has small benign-appearing tonsilith right tonsillar area. No exudate. No erythema.  Neck: Neck supple.  Cardiovascular: Normal rate and regular rhythm.   Pulmonary/Chest: Effort normal and breath sounds normal. No respiratory distress. She has no wheezes. She has no rales.  Lymphadenopathy:    She has no cervical adenopathy.          Assessment & Plan:  Acute bronchitis. Clinically improving.  Finish out prednisone taper. Continue Mucinex. Follow-up promptly for any fever or worsening symptoms

## 2015-11-16 NOTE — Progress Notes (Signed)
Pre visit review using our clinic review tool, if applicable. No additional management support is needed unless otherwise documented below in the visit note. 

## 2015-11-20 ENCOUNTER — Encounter: Payer: Self-pay | Admitting: Family Medicine

## 2015-11-21 ENCOUNTER — Other Ambulatory Visit: Payer: Self-pay | Admitting: Family Medicine

## 2015-11-22 MED ORDER — GUAIFENESIN ER 600 MG PO TB12: 600.0000 mg | ORAL_TABLET | Freq: Four times a day (QID) | ORAL | Status: DC

## 2015-11-23 ENCOUNTER — Other Ambulatory Visit: Payer: Self-pay | Admitting: Family Medicine

## 2015-11-23 MED ORDER — GUAIFENESIN ER 600 MG PO TB12: 600.0000 mg | ORAL_TABLET | Freq: Four times a day (QID) | ORAL | Status: AC

## 2015-11-23 NOTE — Telephone Encounter (Signed)
Mailed to pt

## 2015-11-29 ENCOUNTER — Encounter: Payer: Self-pay | Admitting: Family Medicine

## 2015-11-30 NOTE — Telephone Encounter (Signed)
Any other recommendations???

## 2015-12-11 ENCOUNTER — Encounter: Payer: Self-pay | Admitting: Family Medicine

## 2015-12-11 ENCOUNTER — Ambulatory Visit (INDEPENDENT_AMBULATORY_CARE_PROVIDER_SITE_OTHER): Payer: BLUE CROSS/BLUE SHIELD | Admitting: Family Medicine

## 2015-12-11 VITALS — BP 120/84 | HR 106 | Temp 98.5°F | Ht 62.5 in | Wt 106.6 lb

## 2015-12-11 DIAGNOSIS — R000 Tachycardia, unspecified: Secondary | ICD-10-CM | POA: Diagnosis not present

## 2015-12-11 DIAGNOSIS — I10 Essential (primary) hypertension: Secondary | ICD-10-CM

## 2015-12-11 LAB — T4, FREE: FREE T4: 1.08 ng/dL (ref 0.60–1.60)

## 2015-12-11 LAB — TSH: TSH: 0.82 u[IU]/mL (ref 0.35–4.50)

## 2015-12-11 NOTE — Patient Instructions (Signed)
Continue to minimize caffeine intake Stay well hydrated Follow up for any dizziness or increased pulse consistently over 120

## 2015-12-11 NOTE — Progress Notes (Signed)
   Subjective:    Patient ID: Gabrielle Brock, female    DOB: 07-04-1956, 60 y.o.   MRN: [id number]  HPI  patient seen with concern for recent increased heart rate.  She exercises avidly with swimming and usually has resting pulse around 70.  She has noted recently she has had frequent pulse of around 100.  She did start a new job 2 weeks ago and had a lot of stress and anxiety related to that.  She has not had any subjective palpitations or dizziness. No chest pains.  Weight is slightly down compared to a few months ago  Denies any consistent diarrhea symptoms.  She has a low-grade tremor of her upper extremities at baseline which is unchanged.  Minimal caffeine use. No recent decongestant use.  No illicit drug use. No recent fever. No history of anemia  Hypertension history which has been well controlled with Benicar  Past Medical History  Diagnosis Date  . Hypertension   . Depression   . Osteoporosis   . Hyperlipidemia   . Seasonal allergies   . MVP (mitral valve prolapse) ASYMPTOMATIC  . GERD (gastroesophageal reflux disease)   . History of concussion 20 YRS AGO  . Arthritis   . Chronic headaches    Past Surgical History  Procedure Laterality Date  . Breast biopsy  2006    RIGHT BREAST -  BENIGN  . Strabismus surgery  AGE 12  . Laser retinal repair left eye  20 YRS AGO  . Hysteroscopy w/d&c  11/27/2011    Procedure: DILATATION AND CURETTAGE /HYSTEROSCOPY;  Surgeon: Margarette Asal, MD;  Location: [facility];  Service: Gynecology;  Laterality: N/A;    reports that she has never smoked. She has never used smokeless tobacco. She reports that she drinks alcohol. She reports that she does not use illicit drugs. family history includes Alzheimer's disease in her mother; Heart disease in her other; Heart disease (age of onset: 63) in her father; Hyperlipidemia in her other; Hypertension in her other; Parkinsonism in her father. Allergies  Allergen Reactions  .  Advil [Ibuprofen]     Lips swell  . Cefaclor Hives    ceclor      Review of Systems  Constitutional: Negative for fatigue and unexpected weight change.  Eyes: Negative for visual disturbance.  Respiratory: Negative for cough, chest tightness, shortness of breath and wheezing.   Cardiovascular: Negative for chest pain, palpitations and leg swelling.  Neurological: Negative for dizziness, seizures, syncope, weakness, light-headedness and headaches.       Objective:   Physical Exam  Constitutional: She appears well-developed and well-nourished.  HENT:  Mouth/Throat: Oropharynx is clear and moist.  Neck: Neck supple. No thyromegaly present.  Cardiovascular: Normal rate and regular rhythm.   Heart rate of 88 by my check  Pulmonary/Chest: Effort normal and breath sounds normal. No respiratory distress. She has no wheezes. She has no rales.  Musculoskeletal: She exhibits no edema.  Neurological: She is alert.          Assessment & Plan:  Mild tachycardia by history. Heart rate is 88 today by my check. Doubt clinically significant. Question related to recent anxiety with new job. Heart rate rhythm is regular and she does not have any concerning features such as dizziness or chest pain. Check TSH. Avoid caffeine. Continue to monitor closely. Avoid decongestants.  Hypertension- stable and at goal.

## 2015-12-11 NOTE — Progress Notes (Signed)
Pre visit review using our clinic review tool, if applicable. No additional management support is needed unless otherwise documented below in the visit note. 

## 2016-01-01 ENCOUNTER — Ambulatory Visit (INDEPENDENT_AMBULATORY_CARE_PROVIDER_SITE_OTHER): Payer: BLUE CROSS/BLUE SHIELD | Admitting: Family Medicine

## 2016-01-01 ENCOUNTER — Other Ambulatory Visit (INDEPENDENT_AMBULATORY_CARE_PROVIDER_SITE_OTHER): Payer: BLUE CROSS/BLUE SHIELD

## 2016-01-01 ENCOUNTER — Encounter: Payer: Self-pay | Admitting: Family Medicine

## 2016-01-01 VITALS — BP 122/62 | HR 86 | Ht 62.0 in | Wt 109.0 lb

## 2016-01-01 DIAGNOSIS — M216X2 Other acquired deformities of left foot: Secondary | ICD-10-CM | POA: Diagnosis not present

## 2016-01-01 DIAGNOSIS — D1724 Benign lipomatous neoplasm of skin and subcutaneous tissue of left leg: Secondary | ICD-10-CM | POA: Insufficient documentation

## 2016-01-01 DIAGNOSIS — M79605 Pain in left leg: Secondary | ICD-10-CM

## 2016-01-01 DIAGNOSIS — M216X9 Other acquired deformities of unspecified foot: Secondary | ICD-10-CM | POA: Insufficient documentation

## 2016-01-01 NOTE — Progress Notes (Signed)
Pre visit review using our clinic review tool, if applicable. No additional management support is needed unless otherwise documented below in the visit note. 

## 2016-01-01 NOTE — Patient Instructions (Signed)
Good to see you.  Ice after activity for 10 minutes to elbow and to the ankles Spenco orthotics "total support" online would be great  I like the shoes but do not lace the last eye.  Other shoes Good shoes with rigid bottom.  Jalene Mullet, Merrell or New balance greater then 700, also consider xerelo could help avoid significant impact  Vitamin D 2000 IU daily  Turmeric 500mg  daily but watch the stomach  Exercises 3 times a week.  I would get the vascular test done but it appears to be a lipoma in the ankle  See me again i n6-8 weeks.

## 2016-01-01 NOTE — Assessment & Plan Note (Signed)
  The patient is more of the loss of the transverse arch causing some metatarsalgia of the feet and causing some of the discomfort of the ankles. We discussed with patient about over-the-counter orthotics, home exercises and patient given a handout. Patient given trial of topical anti-rheumatoid. Icing protocol. Patient will come back and see me again in 4-6 weeks to see if she is responding. If not patient may be sent for custom orthotics. They also be a candidate for formal physical therapy.

## 2016-01-01 NOTE — Progress Notes (Signed)
[facility] [address] [place], [place] [address] Phone: [phone] Subjective:    I'm seeing this patient by the request  of:  Eulas Post, MD   CC: Left leg pain and swelling  [id number] Gabrielle Brock is a 60 y.o. female coming in with complaint of left leg pain and swelling. Patient has had this process for quite some time. Patient is seen multiple different providers for this in [place] as well as here. Patient has been in custom orthotics. Patient has had workup including an MRI. Patient brought report today and this was reviewed. Patient had some mild tendinopathy but on the lateral aspect the ankle over the peroneal tendons as well as mild tenosynovitis of the flexor hallux but otherwise unremarkable. Patient continues to have difficult he of the left leg. States that it swells when she stands on a longer amount of time. States that it can be very painful. Has stopped her from doing activities such as walking on a regular basis. Rates the severity of pain a 6 out of 10. Patient denies ever gives out on her but sometimes feels like she is weak on this side. Denies any associated back pain. States that she is noticing more pain on the right ankle as well.     Past Medical History  Diagnosis Date  . Hypertension   . Depression   . Osteoporosis   . Hyperlipidemia   . Seasonal allergies   . MVP (mitral valve prolapse) ASYMPTOMATIC  . GERD (gastroesophageal reflux disease)   . History of concussion 20 YRS AGO  . Arthritis   . Chronic headaches    Past Surgical History  Procedure Laterality Date  . Breast biopsy  2006    RIGHT BREAST -  BENIGN  . Strabismus surgery  AGE 57  . Laser retinal repair left eye  20 YRS AGO  . Hysteroscopy w/d&c  11/27/2011    Procedure: DILATATION AND CURETTAGE /HYSTEROSCOPY;  Surgeon: Margarette Asal, MD;  Location: [facility];  Service: Gynecology;  Laterality: N/A;   Social History    Social History  . Marital Status: Single    Spouse Name: N/A  . Number of Children: N/A  . Years of Education: N/A   Social History Main Topics  . Smoking status: Never Smoker   . Smokeless tobacco: Never Used  . Alcohol Use: Yes     Comment: OCCASIONAL  . Drug Use: No  . Sexual Activity: Not Asked   Other Topics Concern  . None   Social History Narrative   Allergies  Allergen Reactions  . Advil [Ibuprofen]     Lips swell  . Cefaclor Hives    ceclor   Family History  Problem Relation Age of Onset  . Alzheimer's disease Mother   . Parkinsonism Father   . Heart disease Father 41    cabg  . Hyperlipidemia Other   . Hypertension Other   . Heart disease Other     Past medical history, social, surgical and family history all reviewed in electronic medical record.  No pertanent information unless stated regarding to the chief complaint.   Review of Systems: No headache, visual changes, nausea, vomiting, diarrhea, constipation, dizziness, abdominal pain, skin rash, fevers, chills, night sweats, weight loss, swollen lymph nodes, body aches, joint swelling, muscle aches, chest pain, shortness of breath, mood changes.   Objective Blood pressure 122/62, pulse 86, height 5\' 2"  (1.575 m), weight 109 lb (49.442 kg), last  menstrual period 11/30/2011, SpO2 93 %.  General: No apparent distress alert and oriented x3 mood and affect normal, dressed appropriately.  HEENT: Pupils equal, extraocular movements intact  Respiratory: Patient's speak in full sentences and does not appear short of breath  Cardiovascular: No lower extremity edema, non tender, no erythema  Skin: Warm dry intact with no signs of infection or rash on extremities or on axial skeleton.  Abdomen: Soft nontender  Neuro: Cranial nerves II through XII are intact, neurovascularly intact in all extremities with 2+ DTRs and 2+ pulses.  Lymph: No lymphadenopathy of posterior or anterior cervical chain or axillae  bilaterally.  Gait normal with good balance and coordination.  MSK:  Non tender with full range of motion and good stability and symmetric strength and tone of shoulders, elbows, wrist, hip, knee and ankles bilaterally.  Foot exam shows the patient does have mild overpronation of the hindfoot bilaterally left and right. Patient does have some mild splaying between the first and second toes with hammering over the second and third toes. Neurovascular intact patient's left calf has what appears to be an increase in fatty tissue deposits on the medial aspect. Fairly nonspecific. Breakdown of the transverse arch noted as well.  Limited musculoskeletal ultrasound was performed and interpreted by Hulan Saas, M  Limited ultrasound showed that patient does have what appears to be a lipoma for heterotropic changes. No significant hypoechoic changes or increasing in Doppler flow. Patient does have what appears to be a accessory posterior tibialis tendon but comparing to the contralateral side is there as well. No bony normality noted otherwise.  Impression: Lipoma of the medial calf   Impression and Recommendations:     This case required medical decision making of moderate complexity.      Note: This dictation was prepared with Dragon dictation along with smaller phrase technology. Any transcriptional errors that result from this process are unintentional.

## 2016-01-01 NOTE — Assessment & Plan Note (Signed)
Patient does have a seems to be hypertrophia of the subcutaneous tissues of the sling. I do not see any vascularization and do not think further workup is necessary at this time. Likely secondary to a lipoma. We discussed about compression with patient states that having the worse previously. Discuss that everything else would likely be more for cosmetic for any type workup or intervention. Patient may go see a pain specialist to see if there is anything that they can do. At this point I don't think there is any further options for her.

## 2016-01-04 ENCOUNTER — Encounter: Payer: Self-pay | Admitting: Family Medicine

## 2016-01-15 ENCOUNTER — Ambulatory Visit (INDEPENDENT_AMBULATORY_CARE_PROVIDER_SITE_OTHER): Payer: BLUE CROSS/BLUE SHIELD | Admitting: Family Medicine

## 2016-01-15 VITALS — BP 120/82 | HR 92 | Temp 98.5°F | Ht 62.0 in | Wt 107.7 lb

## 2016-01-15 DIAGNOSIS — R140 Abdominal distension (gaseous): Secondary | ICD-10-CM

## 2016-01-15 DIAGNOSIS — K219 Gastro-esophageal reflux disease without esophagitis: Secondary | ICD-10-CM | POA: Diagnosis not present

## 2016-01-15 NOTE — Patient Instructions (Signed)
Recommend consider supplement of Zantac or Pepcid each morning for acid reflux symptoms Continue with night time use of Nexium We will get gastric emptying study to rule out gastroparesis.

## 2016-01-15 NOTE — Progress Notes (Signed)
Subjective:    Patient ID: Gabrielle Brock, female    DOB: 04-09-1956, 60 y.o.   MRN: [id number]  HPI  patient is here today to discuss some GI symptoms.  She has a history of frequently feeling "bloated". She's had both abdominal ultrasound as well as pelvic ultrasound recently. Abdominal ultrasound had been ordered by GI and was unrevealing. She's had lab work to rule out things like celiac disease which has been unremarkable. Recent change of job and has had increased job stress.   She complains of "acid reflux "symptoms. She does have frequent epigastric burning symptoms would also complains of frequent postprandial sensation of fullness. She feels her stomach is not emptying appropriately. She's had rare episodes of nausea but no vomiting and no consistent symptoms of postprandial nausea. She currently takes Nexium 20 mg at night but still has frequent daytime breakthrough GERD symptoms. She's not had any recent appetite or weight changes. She's had difficulty gaining weight but no weight loss. She has not had recent endoscopy. Colonoscopy is up-to-date.  Past Medical History  Diagnosis Date  . Hypertension   . Depression   . Osteoporosis   . Hyperlipidemia   . Seasonal allergies   . MVP (mitral valve prolapse) ASYMPTOMATIC  . GERD (gastroesophageal reflux disease)   . History of concussion 20 YRS AGO  . Arthritis   . Chronic headaches    Past Surgical History  Procedure Laterality Date  . Breast biopsy  2006    RIGHT BREAST -  BENIGN  . Strabismus surgery  AGE 54  . Laser retinal repair left eye  20 YRS AGO  . Hysteroscopy w/d&c  11/27/2011    Procedure: DILATATION AND CURETTAGE /HYSTEROSCOPY;  Surgeon: Margarette Asal, MD;  Location: [facility];  Service: Gynecology;  Laterality: N/A;    reports that she has never smoked. She has never used smokeless tobacco. She reports that she drinks alcohol. She reports that she does not use illicit drugs. family history  includes Alzheimer's disease in her mother; Heart disease in her other; Heart disease (age of onset: 24) in her father; Hyperlipidemia in her other; Hypertension in her other; Parkinsonism in her father. Allergies  Allergen Reactions  . Advil [Ibuprofen]     Lips swell  . Cefaclor Hives    ceclor      Review of Systems  Constitutional: Negative for fever, chills, appetite change and unexpected weight change.  Respiratory: Negative for shortness of breath.   Cardiovascular: Negative for chest pain.  Gastrointestinal: Positive for abdominal pain. Negative for vomiting, diarrhea and constipation.  Genitourinary: Negative for dysuria.  Neurological: Negative for dizziness.       Objective:   Physical Exam  Constitutional: She appears well-developed and well-nourished.  Cardiovascular: Normal rate and regular rhythm.   Pulmonary/Chest: Effort normal and breath sounds normal. No respiratory distress. She has no wheezes. She has no rales.  Abdominal: Soft. Bowel sounds are normal. She exhibits no distension and no mass. There is no rebound and no guarding.  Minimally tender epigastric region. No masses palpated.          Assessment & Plan:   #1 GERD. Patient describing frequent breakthrough symptoms even on low-dose Nexium. We have suggested that she supplement with over-the-counter Zantac or Pepcid. Suspect some of this may be stress related. She does not have any red flags such as weight loss or dysphagia. She has pending follow-up with GI   #2 frequent postprandial bloating. No specific risk  factors for gastroparesis but given frequent nature of postprandial symptoms will obtain gastric emptying study to further assess

## 2016-01-16 ENCOUNTER — Encounter: Payer: Self-pay | Admitting: Family Medicine

## 2016-01-19 ENCOUNTER — Telehealth: Payer: Self-pay | Admitting: Family Medicine

## 2016-01-19 ENCOUNTER — Ambulatory Visit (HOSPITAL_COMMUNITY)
Admission: RE | Admit: 2016-01-19 | Discharge: 2016-01-19 | Disposition: A | Discharge status: Home / Self Care | Payer: BLUE CROSS/BLUE SHIELD | Source: Ambulatory Visit | Attending: Family Medicine | Admitting: Family Medicine

## 2016-01-19 DIAGNOSIS — R140 Abdominal distension (gaseous): Secondary | ICD-10-CM | POA: Diagnosis present

## 2016-01-19 MED ORDER — TECHNETIUM TC 99M SULFUR COLLOID
2.1000 | Freq: Once | INTRAVENOUS | Status: AC | PRN
  Administered 2016-01-19: 2.1 via ORAL

## 2016-01-19 NOTE — Telephone Encounter (Signed)
Looks like pt had an Gastric emptying Study performed. Results are in.

## 2016-01-19 NOTE — Telephone Encounter (Signed)
FYI ;Pt call to say she has completed her test and they told her the doctor should have the results this afternoon.

## 2016-01-22 ENCOUNTER — Other Ambulatory Visit: Payer: Self-pay | Admitting: Family Medicine

## 2016-01-22 MED ORDER — FAMOTIDINE 10 MG PO TABS: 10.0000 mg | ORAL_TABLET | Freq: Two times a day (BID) | ORAL | Status: DC

## 2016-01-26 ENCOUNTER — Ambulatory Visit (HOSPITAL_COMMUNITY): Payer: BLUE CROSS/BLUE SHIELD

## 2016-01-31 ENCOUNTER — Encounter: Payer: Self-pay | Admitting: Family Medicine

## 2016-02-01 ENCOUNTER — Other Ambulatory Visit: Payer: Self-pay | Admitting: Gastroenterology

## 2016-02-01 DIAGNOSIS — R140 Abdominal distension (gaseous): Secondary | ICD-10-CM

## 2016-02-01 DIAGNOSIS — R109 Unspecified abdominal pain: Secondary | ICD-10-CM

## 2016-02-02 ENCOUNTER — Ambulatory Visit
Admission: RE | Admit: 2016-02-02 | Discharge: 2016-02-02 | Disposition: A | Discharge status: Home / Self Care | Payer: BLUE CROSS/BLUE SHIELD | Source: Ambulatory Visit | Attending: Gastroenterology | Admitting: Gastroenterology

## 2016-02-02 DIAGNOSIS — R140 Abdominal distension (gaseous): Secondary | ICD-10-CM

## 2016-02-02 DIAGNOSIS — R109 Unspecified abdominal pain: Secondary | ICD-10-CM

## 2016-02-05 ENCOUNTER — Encounter: Payer: Self-pay | Admitting: Family Medicine

## 2016-02-06 NOTE — Telephone Encounter (Signed)
FYI

## 2016-02-12 ENCOUNTER — Encounter: Payer: Self-pay | Admitting: Family Medicine

## 2016-02-12 ENCOUNTER — Other Ambulatory Visit: Payer: Self-pay | Admitting: Family Medicine

## 2016-02-12 DIAGNOSIS — R42 Dizziness and giddiness: Secondary | ICD-10-CM

## 2016-02-13 ENCOUNTER — Ambulatory Visit: Payer: BLUE CROSS/BLUE SHIELD | Admitting: Family Medicine

## 2016-02-13 ENCOUNTER — Encounter: Payer: Self-pay | Admitting: Cardiology

## 2016-02-13 NOTE — Progress Notes (Signed)
Patient ID: Gabrielle Brock, female   DOB: 1956-02-06, 60 y.o.   MRN: [id number]  Gabrielle Brock, Gabrielle Brock    Date of visit:  02/13/2016 DOB:  06/07/1956    Age:  60 yrs. Medical record number:  [id number]     Account number:  [id number] Primary Care Provider: Carolann Littler ____________________________ CURRENT DIAGNOSES  1. Chest pain  2. Abnormal electrocardiogram [ECG] ____________________________ ALLERGIES  Ceclor, Hives and/or rash  Ibuprofen, Facial swelling ____________________________ MEDICATIONS  1. zolpidem 5 mg tablet, QHS  2. alprazolam 0.25 mg tablet, take at bedtime as needed  3. Pepcid 20 mg tablet, 1 p.o. daily  4. Vitamin C 1,000 mg tablet, 1 p.o. daily  5. Ambien 10 mg tablet, QHS  6. Benicar 5 mg tablet, 1 p.o. daily  7. Fioricet 50 mg-300 mg-40 mg capsule, PRN  8. maprotiline 75 mg tablet, 1 p.o. daily ____________________________ HISTORY OF PRESENT ILLNESS This 60 year old female is seen for evaluation of chest discomfort and to assess cardiac risk. The patient has had a history of significant dyspepsia as well as abdominal bloating. She is in the process of getting this evaluated but has had a gastric emptying study and has tried several different things to get this under control including special diets and other things. More recently she became concerned that the abdominal symptoms and she is having which are described more as reflux might be related to her heart. She had an episode of right-sided chest pain that was sharp in did not occur with exertion had another episode of the left side that was also sharp. She has been able to exercise including swimming laps and does not have any significant exertionally related symptoms. She has an anxiety disorder as well as some depression. She was concerned about her cardiac risk in that her father had cardiac disease in his 89s and wished to have a cardiac evaluation. She denies PND, orthopnea or claudication. At one point in the past she was  told that she had mitral valve prolapse but evidently this was refuted later. ____________________________ PAST HISTORY  Past Medical Illnesses:  hypertension, GERD, depression, hyperlipidemia, chronic headaches;  Cardiovascular Illnesses:  no previous history of cardiac disease;  Infectious Diseases:  no previous history of significant infectious diseases;  Surgical Procedures:  hyseroscopy, breast biopsy, strabismus surgery;  Trauma History:  no previous history of significant trauma;  NYHA Classification:  I;  Canadian Angina Classification:  Class 0: Asymptomatic;  Cardiology Procedures-Invasive:  no previous interventional or invasive cardiology procedures;  Cardiology Procedures-Noninvasive:  echocardiogram;  LVEF not documented,   ____________________________ FAMILY HISTORY Brother -- Brother alive and well Father -- Father dead, Parkinsonism, History of coronary artery bypass grafting Mother -- Mother dead, Dementia/Alzheimers Sister -- Sister alive and well Sister -- Sister alive and well ____________________________ SOCIAL HISTORY Alcohol Use:  socially;  Smoking:  nonsmoker;  Diet:  regular diet;  Lifestyle:  single;  Exercise:  exercises regularly;  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  Integumentary:acne Eyes: wears eye glasses/contact lenses, retinal detachment Ears, Nose, Throat, Mouth:  hearing aide left ear Respiratory: denies dyspnea, cough, wheezing or hemoptysis. Cardiovascular:  please review HPI Abdominal: see HPIGenitourinary-Female: no dysuria, urgency, frequency, UTIs, or stress incontinence Musculoskeletal:  Raynaud's syndrome, arthritis of the right knee Neurological:  migraine headaches Psychiatric:  anxiety disorder, depression Hematological/Immunologic:  denies any food allergies, bleeding  disorders.  ____________________________ PHYSICAL EXAMINATION VITAL SIGNS  Blood Pressure:  116/74  , 102/74   and 160/80 Retaken by MD   Pulse:  88/min. Weight:  109.00 lbs. Height:  62.5"BMI: 19  Constitutional:  anxious, thin, pleasant white female, in no acute distress Skin:  warm and dry to touch, no apparent skin lesions, or masses noted. Head:  normocephalic, normal hair pattern, no masses or tenderness Eyes:  EOMS Intact, PERRLA, C and S clear, Funduscopic exam not done. ENT:  ears, nose and throat reveal no gross abnormalities.  Dentition good. Neck:  supple, without massess. No JVD, thyromegaly or carotid bruits. Carotid upstroke normal. Chest:  normal symmetry, clear to auscultation. Cardiac:  regular rhythm, normal S1 and S2, No S3 or S4, no murmurs, gallops or rubs detected. Abdomen:  abdomen soft, no masses, no hepatosplenomegaly, mild tenderness in epigastrium Peripheral Pulses:  the femoral,dorsalis pedis, and posterior tibial pulses are full and equal bilaterally with no bruits auscultated. Extremities & Back:  no deformities, clubbing, cyanosis, erythema or edema observed. Normal muscle strength and tone. Neurological:  no gross motor or sensory deficits noted, affect appropriate, oriented x3. ____________________________ MOST RECENT LIPID PANEL 03/24/15  CHOL TOTL 205 mg/dl, LDL 127 NM, HDL 66 mg/dl, TRIGLYCER 56 mg/dl and CHOL/HDL 3 (Calc) ____________________________ IMPRESSIONS/PLAN  1. Chest pain with atypical features that do not suggest angina 2. Abnormal EKG with left axis deviation and incomplete right bundle branch block 3. Family history of cardiac disease 4. Hypertension 5. Mild hyperlipidemia  Recommendations:  She is due to have a GI evaluation soon including an endoscopy. Her EKG shows left axis deviation and incomplete right bundle branch block. I would suggest an echocardiogram to look for structural heart disease and valvular disease as well as a  right-sided chambers. She should have a myocardial perfusion scan because of the abnormal EKG and family history with hypertension.  ____________________________ TODAYS ORDERS  1. Treadmill 1 day Cardiolite: First Available  2. 2D, color flow, doppler: First Available  3. 12 Lead EKG: Today                       ____________________________ Cardiology Physician:  Kerry Hough MD [facility]

## 2016-02-16 ENCOUNTER — Other Ambulatory Visit: Payer: Self-pay | Admitting: Gastroenterology

## 2016-02-19 ENCOUNTER — Encounter (HOSPITAL_COMMUNITY): Payer: Self-pay | Admitting: *Deleted

## 2016-02-19 ENCOUNTER — Other Ambulatory Visit: Payer: Self-pay | Admitting: Gastroenterology

## 2016-02-20 ENCOUNTER — Other Ambulatory Visit: Payer: Self-pay | Admitting: Gastroenterology

## 2016-02-21 ENCOUNTER — Encounter (HOSPITAL_COMMUNITY): Payer: Self-pay | Admitting: *Deleted

## 2016-02-21 ENCOUNTER — Ambulatory Visit (HOSPITAL_COMMUNITY)
Admission: RE | Admit: 2016-02-21 | Discharge: 2016-02-21 | Disposition: A | Discharge status: Home / Self Care | Payer: BLUE CROSS/BLUE SHIELD | Source: Ambulatory Visit | Attending: Gastroenterology | Admitting: Gastroenterology

## 2016-02-21 ENCOUNTER — Ambulatory Visit (HOSPITAL_COMMUNITY): Payer: BLUE CROSS/BLUE SHIELD | Admitting: Anesthesiology

## 2016-02-21 ENCOUNTER — Encounter (HOSPITAL_COMMUNITY)
Admission: RE | Disposition: A | Discharge status: Home / Self Care | Payer: Self-pay | Source: Ambulatory Visit | Attending: Gastroenterology

## 2016-02-21 ENCOUNTER — Encounter: Payer: Self-pay | Admitting: Family Medicine

## 2016-02-21 DIAGNOSIS — R140 Abdominal distension (gaseous): Secondary | ICD-10-CM | POA: Insufficient documentation

## 2016-02-21 DIAGNOSIS — R1013 Epigastric pain: Secondary | ICD-10-CM | POA: Insufficient documentation

## 2016-02-21 DIAGNOSIS — F329 Major depressive disorder, single episode, unspecified: Secondary | ICD-10-CM | POA: Diagnosis not present

## 2016-02-21 DIAGNOSIS — I10 Essential (primary) hypertension: Secondary | ICD-10-CM | POA: Insufficient documentation

## 2016-02-21 DIAGNOSIS — K219 Gastro-esophageal reflux disease without esophagitis: Secondary | ICD-10-CM | POA: Insufficient documentation

## 2016-02-21 DIAGNOSIS — Z79899 Other long term (current) drug therapy: Secondary | ICD-10-CM | POA: Diagnosis not present

## 2016-02-21 HISTORY — PX: ESOPHAGOGASTRODUODENOSCOPY (EGD) WITH PROPOFOL: SHX5813

## 2016-02-21 HISTORY — DX: Family history of other specified conditions: Z84.89

## 2016-02-21 HISTORY — DX: Dislocation of jaw, unspecified side, initial encounter: S03.00XA

## 2016-02-21 SURGERY — ESOPHAGOGASTRODUODENOSCOPY (EGD) WITH PROPOFOL
Anesthesia: Monitor Anesthesia Care

## 2016-02-21 MED ORDER — LIDOCAINE HCL (CARDIAC) 20 MG/ML IV SOLN
INTRAVENOUS | Status: DC | PRN
  Administered 2016-02-21: 100 mg via INTRATRACHEAL

## 2016-02-21 MED ORDER — ONDANSETRON HCL 4 MG/2ML IJ SOLN
INTRAMUSCULAR | Status: DC | PRN
  Administered 2016-02-21: 4 mg via INTRAVENOUS

## 2016-02-21 MED ORDER — PROPOFOL 10 MG/ML IV BOLUS
INTRAVENOUS | Status: AC
  Filled 2016-02-21: qty 20

## 2016-02-21 MED ORDER — ONDANSETRON HCL 4 MG/2ML IJ SOLN
INTRAMUSCULAR | Status: AC
  Filled 2016-02-21: qty 2

## 2016-02-21 MED ORDER — SODIUM CHLORIDE 0.9 % IV SOLN: INTRAVENOUS | Status: DC

## 2016-02-21 MED ORDER — PROPOFOL 500 MG/50ML IV EMUL
INTRAVENOUS | Status: DC | PRN
  Administered 2016-02-21: 300 ug/kg/min via INTRAVENOUS

## 2016-02-21 MED ORDER — LIDOCAINE HCL (CARDIAC) 20 MG/ML IV SOLN
INTRAVENOUS | Status: AC
  Filled 2016-02-21: qty 5

## 2016-02-21 MED ORDER — LACTATED RINGERS IV SOLN
INTRAVENOUS | Status: DC
  Administered 2016-02-21: 1000 mL via INTRAVENOUS

## 2016-02-21 SURGICAL SUPPLY — 14 items

## 2016-02-21 NOTE — Anesthesia Preprocedure Evaluation (Signed)
Anesthesia Evaluation  Patient identified by MRN, date of birth, ID band Patient awake    Reviewed: Allergy & Precautions, NPO status , Patient's Chart, lab work & pertinent test results  History of Anesthesia Complications Negative for: history of anesthetic complications  Airway Mallampati: II  TM Distance: >3 FB Neck ROM: Full    Dental  (+) Teeth Intact   Pulmonary neg pulmonary ROS,    breath sounds clear to auscultation       Cardiovascular hypertension, Pt. on medications (-) angina(-) Past MI and (-) CHF  Rhythm:Regular     Neuro/Psych  Headaches, neg Seizures PSYCHIATRIC DISORDERS Depression    GI/Hepatic Neg liver ROS, GERD  Medicated and Poorly Controlled,  Endo/Other  negative endocrine ROS  Renal/GU negative Renal ROS     Musculoskeletal  (+) Arthritis ,   Abdominal   Peds  Hematology negative hematology ROS (+)   Anesthesia Other Findings H/o tmj  Reproductive/Obstetrics                             Anesthesia Physical Anesthesia Plan  ASA: II  Anesthesia Plan: MAC   Post-op Pain Management:    Induction: Intravenous  Airway Management Planned: Natural Airway, Nasal Cannula and Simple Face Mask  Additional Equipment: None  Intra-op Plan:   Post-operative Plan:   Informed Consent: I have reviewed the patients History and Physical, chart, labs and discussed the procedure including the risks, benefits and alternatives for the proposed anesthesia with the patient or authorized representative who has indicated his/her understanding and acceptance.   Dental advisory given  Plan Discussed with: CRNA and Surgeon  Anesthesia Plan Comments:         Anesthesia Quick Evaluation

## 2016-02-21 NOTE — H&P (Signed)
Patient interval history reviewed.  Patient examined again.  There has been no change from documented H/P dated 02/01/16 (scanned into chart from our office) except as documented above.  Assessment:  1.  Abdominal pain. 2.  Bloating. 3.  GERD.  Plan:  1.  Endoscopy. 2.  Risks (bleeding, infection, bowel perforation that could require surgery, sedation-related changes in cardiopulmonary systems), benefits (identification and possible treatment of source of symptoms, exclusion of certain causes of symptoms), and alternatives (watchful waiting, radiographic imaging studies, empiric medical treatment) of upper endoscopy (EGD) were explained to patient/family in detail and patient wishes to proceed.

## 2016-02-21 NOTE — Discharge Instructions (Signed)
Endoscopy °Care After °Please read the instructions outlined below and refer to this sheet in the next few weeks. These discharge instructions provide you with general information on caring for yourself after you leave the hospital. Your doctor may also give you specific instructions. While your treatment has been planned according to the most current medical practices available, unavoidable complications occasionally occur. If you have any problems or questions after discharge, please call Dr. Bader Stubblefield ([facility]) at [phone]. ° °HOME CARE INSTRUCTIONS °Activity °· You may resume your regular activity but move at a slower pace for the next 24 hours.  °· Take frequent rest periods for the next 24 hours.  °· Walking will help expel (get rid of) the air and reduce the bloated feeling in your abdomen.  °· No driving for 24 hours (because of the anesthesia (medicine) used during the test).  °· You may shower.  °· Do not sign any important legal documents or operate any machinery for 24 hours (because of the anesthesia used during the test).  °Nutrition °· Drink plenty of fluids.  °· You may resume your normal diet.  °· Begin with a light meal and progress to your normal diet.  °· Avoid alcoholic beverages for 24 hours or as instructed by your caregiver.  °Medications °You may resume your normal medications unless your caregiver tells you otherwise. °What you can expect today °· You may experience abdominal discomfort such as a feeling of fullness or "gas" pains.  °· You may experience a sore throat for 2 to 3 days. This is normal. Gargling with salt water may help this.  °·  °SEEK IMMEDIATE MEDICAL CARE IF: °· You have excessive nausea (feeling sick to your stomach) and/or vomiting.  °· You have severe abdominal pain and distention (swelling).  °· You have trouble swallowing.  °· You have a temperature over 100° F (37.8° C).  °· You have rectal bleeding or vomiting of blood.  °Document Released:  05/28/2004 Document Revised: 06/26/2011 Document Reviewed: 12/09/2007 °ExitCare® Patient Information ©2012 [employer]. °

## 2016-02-21 NOTE — Transfer of Care (Signed)
Immediate Anesthesia Transfer of Care Note  Patient: Gabrielle Brock  Procedure(s) Performed: Procedure(s): ESOPHAGOGASTRODUODENOSCOPY (EGD) WITH PROPOFOL (N/A)  Patient Location: PACU  Anesthesia Type:MAC  Level of Consciousness:  sedated, patient cooperative and responds to stimulation  Airway & Oxygen Therapy:Patient Spontanous Breathing and Patient connected to face mask oxgen  Post-op Assessment:  Report given to PACU RN and Post -op Vital signs reviewed and stable  Post vital signs:  Reviewed and stable  Last Vitals:  Filed Vitals:   02/21/16 1127  Pulse: 87  Temp: 36.7 C  Resp: 17    Complications: No apparent anesthesia complications

## 2016-02-21 NOTE — Op Note (Signed)
[facility] Patient Name: Gabrielle Brock Procedure Date: 02/21/2016 MRN: [id number] Attending MD: Arta Silence , MD Date of Birth: 1956/03/24 CSN:  Age: 60 Admit Type: Outpatient Procedure:                Upper GI endoscopy Indications:              Epigastric abdominal pain, Suspected                            gastro-esophageal reflux disease, Abdominal bloating Providers:                Arta Silence, MD, Tory Emerald, RN, Corliss Parish, Technician Referring MD:              Medicines:                Propofol per Anesthesia Complications:            No immediate complications. Estimated Blood Loss:     Estimated blood loss: none. Procedure:                Pre-Anesthesia Assessment:                           - Prior to the procedure, a History and Physical                            was performed, and patient medications and                            allergies were reviewed. The patient's tolerance of                            previous anesthesia was also reviewed. The risks                            and benefits of the procedure and the sedation                            options and risks were discussed with the patient.                            All questions were answered, and informed consent                            was obtained. Prior Anticoagulants: The patient has                            taken no previous anticoagulant or antiplatelet                            agents. ASA Grade Assessment: II - A patient with                            mild  systemic disease. After reviewing the risks                            and benefits, the patient was deemed in                            satisfactory condition to undergo the procedure.                           After obtaining informed consent, the endoscope was                            passed under direct vision. Throughout the                            procedure, the  patient's blood pressure, pulse, and                            oxygen saturations were monitored continuously. The                            [id number]) scope was introduced through the                            mouth, and advanced to the second part of duodenum.                            The upper GI endoscopy was accomplished without                            difficulty. The patient tolerated the procedure                            well. Scope In: Scope Out: Findings:      The examined esophagus was normal.      The entire examined stomach was normal.      The duodenal bulb, first portion of the duodenum and second portion of       the duodenum were normal. Impression:               - Normal esophagus.                           - Normal stomach.                           - Normal duodenal bulb, first portion of the                            duodenum and second portion of the duodenum.                           - Normal examination. Moderate Sedation:      N/A- Per Anesthesia Care Recommendation:           - Patient has a contact number available for  emergencies. The signs and symptoms of potential                            delayed complications were discussed with the                            patient. Return to normal activities tomorrow.                            Written discharge instructions were provided to the                            patient.                           - Discharge patient to home (via wheelchair).                           - Continue present medications.                           - Resume previous diet today.                           - Return to GI clinic in 3 months.                           - Return to referring physician as previously                            scheduled. Procedure Code(s):        --- Professional ---                           [phone], Esophagogastroduodenoscopy, flexible,                             transoral; diagnostic, including collection of                            specimen(s) by brushing or washing, when performed                            (separate procedure) Diagnosis Code(s):        --- Professional ---                           R10.13, Epigastric pain                           R14.0, Abdominal distension (gaseous) CPT copyright 2016 American Medical Association. All rights reserved. The codes documented in this report are preliminary and upon coder review may  be revised to meet current compliance requirements. Arta Silence, MD 02/21/2016 12:32:38 PM This report has been signed electronically. Number of Addenda: 0

## 2016-02-22 ENCOUNTER — Encounter (HOSPITAL_COMMUNITY): Payer: Self-pay | Admitting: Gastroenterology

## 2016-02-24 NOTE — Anesthesia Postprocedure Evaluation (Signed)
Anesthesia Post Note  Patient: Gabrielle Brock  Procedure(s) Performed: Procedure(s) (LRB): ESOPHAGOGASTRODUODENOSCOPY (EGD) WITH PROPOFOL (N/A)  Patient location during evaluation: Endoscopy Anesthesia Type: MAC Level of consciousness: awake Pain management: pain level controlled Vital Signs Assessment: post-procedure vital signs reviewed and stable Respiratory status: spontaneous breathing Cardiovascular status: stable Postop Assessment: no signs of nausea or vomiting Anesthetic complications: no    Last Vitals:  Filed Vitals:   02/21/16 1250 02/21/16 1255  BP: 149/87   Pulse: 78 80  Temp:    Resp: 19 15    Last Pain: There were no vitals filed for this visit.               Mikisha Roseland

## 2016-03-01 ENCOUNTER — Telehealth: Payer: Self-pay | Admitting: Family Medicine

## 2016-03-01 DIAGNOSIS — M810 Age-related osteoporosis without current pathological fracture: Secondary | ICD-10-CM

## 2016-03-01 NOTE — Telephone Encounter (Signed)
Pt would like to go to [facility] for her cpx labs. Can you put the order in?  Pt has cpx on 5/16. Thanks!

## 2016-03-04 ENCOUNTER — Other Ambulatory Visit (INDEPENDENT_AMBULATORY_CARE_PROVIDER_SITE_OTHER): Payer: 59

## 2016-03-04 ENCOUNTER — Other Ambulatory Visit: Payer: Self-pay | Admitting: Family Medicine

## 2016-03-04 DIAGNOSIS — Z0000 Encounter for general adult medical examination without abnormal findings: Secondary | ICD-10-CM

## 2016-03-04 DIAGNOSIS — M810 Age-related osteoporosis without current pathological fracture: Secondary | ICD-10-CM

## 2016-03-04 LAB — BASIC METABOLIC PANEL
BUN: 19 mg/dL (ref 6–23)
CALCIUM: 9.4 mg/dL (ref 8.4–10.5)
CHLORIDE: 105 meq/L (ref 96–112)
CO2: 28 meq/L (ref 19–32)
CREATININE: 0.82 mg/dL (ref 0.40–1.20)
GFR: 76.1 mL/min (ref >60.00)
GLUCOSE: 96 mg/dL (ref 70–99)
Potassium: 4.2 mEq/L (ref 3.5–5.1)
Sodium: 142 mEq/L (ref 135–145)

## 2016-03-04 LAB — CBC WITH DIFFERENTIAL/PLATELET
BASOS PCT: 0.7 % (ref 0.0–3.0)
Basophils Absolute: 0.1 10*3/uL (ref 0.0–0.1)
EOS PCT: 8.7 % — AB (ref 0.0–5.0)
Eosinophils Absolute: 0.9 10*3/uL — ABNORMAL HIGH (ref 0.0–0.7)
HCT: 36.6 % (ref 36.0–46.0)
Hemoglobin: 12.3 g/dL (ref 12.0–15.0)
LYMPHS ABS: 3 10*3/uL (ref 0.7–4.0)
Lymphocytes Relative: 30.9 % (ref 12.0–46.0)
MCHC: 33.5 g/dL (ref 30.0–36.0)
MCV: 88.8 fl (ref 78.0–100.0)
MONO ABS: 0.7 10*3/uL (ref 0.1–1.0)
Monocytes Relative: 6.9 % (ref 3.0–12.0)
NEUTROS PCT: 52.8 % (ref 43.0–77.0)
Neutro Abs: 5.2 10*3/uL (ref 1.4–7.7)
Platelets: 286 10*3/uL (ref 150.0–400.0)
RBC: 4.13 Mil/uL (ref 3.87–5.11)
RDW: 13.6 % (ref 11.5–15.5)
WBC: 9.8 10*3/uL (ref 4.0–10.5)

## 2016-03-04 LAB — URINALYSIS, ROUTINE W REFLEX MICROSCOPIC
Bilirubin Urine: NEGATIVE
Ketones, ur: NEGATIVE
Leukocytes, UA: NEGATIVE
Nitrite: NEGATIVE
SPECIFIC GRAVITY, URINE: 1.02 (ref 1.000–1.030)
Total Protein, Urine: NEGATIVE
UROBILINOGEN UA: 0.2 (ref 0.0–1.0)
Urine Glucose: NEGATIVE
pH: 6 (ref 5.0–8.0)

## 2016-03-04 LAB — HEPATIC FUNCTION PANEL
ALT: 20 U/L (ref 0–35)
AST: 23 U/L (ref 0–37)
Albumin: 4.4 g/dL (ref 3.5–5.2)
Alkaline Phosphatase: 66 U/L (ref 39–117)
BILIRUBIN TOTAL: 0.3 mg/dL (ref 0.2–1.2)
Bilirubin, Direct: 0.1 mg/dL (ref 0.0–0.3)
Total Protein: 7.2 g/dL (ref 6.0–8.3)

## 2016-03-04 LAB — TSH: TSH: 2.21 u[IU]/mL (ref 0.35–4.50)

## 2016-03-04 LAB — LIPID PANEL
Cholesterol: 221 mg/dL — ABNORMAL HIGH (ref 0–200)
HDL: 70.9 mg/dL (ref >39.00)
LDL Cholesterol: 133 mg/dL — ABNORMAL HIGH (ref 0–99)
NONHDL: 149.6
Total CHOL/HDL Ratio: 3
Triglycerides: 83 mg/dL (ref 0.0–149.0)
VLDL: 16.6 mg/dL (ref 0.0–40.0)

## 2016-03-04 LAB — VITAMIN D 25 HYDROXY (VIT D DEFICIENCY, FRACTURES): VITD: 37.22 ng/mL (ref 30.00–100.00)

## 2016-03-04 NOTE — Telephone Encounter (Signed)
Pt states her employer needs to have her Vitamin D added to the cpe labs this am.  Pt has a form and that lab is listed.  Pt states [facility] can add that order if you will put in. Is that ok? Pt has already had her labs drawn.

## 2016-03-04 NOTE — Telephone Encounter (Signed)
Order has been entered

## 2016-03-12 ENCOUNTER — Ambulatory Visit (INDEPENDENT_AMBULATORY_CARE_PROVIDER_SITE_OTHER): Payer: 59 | Admitting: Family Medicine

## 2016-03-12 VITALS — BP 120/84 | HR 84 | Temp 98.0°F | Ht 62.0 in | Wt 109.0 lb

## 2016-03-12 DIAGNOSIS — Z0000 Encounter for general adult medical examination without abnormal findings: Secondary | ICD-10-CM

## 2016-03-12 MED ORDER — AZELASTINE HCL 0.1 % NA SOLN: NASAL | Status: DC

## 2016-03-12 NOTE — Patient Instructions (Signed)
Check on coverage for shingles vaccine You need to get tetanus booster.

## 2016-03-12 NOTE — Progress Notes (Signed)
Pre visit review using our clinic review tool, if applicable. No additional management support is needed unless otherwise documented below in the visit note. 

## 2016-03-12 NOTE — Progress Notes (Signed)
Subjective:    Patient ID: Gabrielle Brock, female    DOB: 1956-10-16, 60 y.o.   MRN: [id number]  HPI Patient seen for physical exam. She recently had some atypical chest symptoms and had nuclear stress test which was unremarkable. She also had recent upper endoscopy which was normal. She has a long history of frequent "bloating" symptoms after eating. She is followed by GI regarding that.  She has past history of hypertension and currently takes very low-dose Benicar 5 mg daily. Occasional lightheadedness with exercise. She has tried stopping this in the past but has had issues with blood pressure going back up. Colonoscopy up-to-date. She needs tetanus booster but declines today. She wishes to check with insurance regarding shingles vaccination. She has seen GYN regarding Pap smears. Mammogram up-to-date  Past Medical History  Diagnosis Date  . Hypertension   . Depression   . Osteoporosis   . Hyperlipidemia   . Seasonal allergies   . MVP (mitral valve prolapse) ASYMPTOMATIC  . GERD (gastroesophageal reflux disease)   . History of concussion 20 YRS AGO  . Arthritis   . Chronic headaches   . Family history of adverse reaction to anesthesia     father had CABG age 53's and very hard to wake up from surgery  . TMJ (dislocation of temporomandibular joint)    Past Surgical History  Procedure Laterality Date  . Breast biopsy  2006    RIGHT BREAST -  BENIGN  . Strabismus surgery  AGE 19  . Laser retinal repair left eye  20 YRS AGO  . Hysteroscopy w/d&c  11/27/2011    Procedure: DILATATION AND CURETTAGE /HYSTEROSCOPY;  Surgeon: Margarette Asal, MD;  Location: [facility];  Service: Gynecology;  Laterality: N/A;  . Dilation and curettage of uterus  2013  . Esophagogastroduodenoscopy (egd) with propofol N/A 02/21/2016    Procedure: ESOPHAGOGASTRODUODENOSCOPY (EGD) WITH PROPOFOL;  Surgeon: Arta Silence, MD;  Location: [facility];  Service: Endoscopy;  Laterality: N/A;     reports that she has never smoked. She has never used smokeless tobacco. She reports that she drinks alcohol. She reports that she does not use illicit drugs. family history includes Alzheimer's disease in her mother; Heart disease in her other; Heart disease (age of onset: 2) in her father; Hyperlipidemia in her other; Hypertension in her other; Parkinsonism in her father. Allergies  Allergen Reactions  . Advil [Ibuprofen] Other (See Comments)    Lips swell  . Cefaclor Hives     Review of Systems  Constitutional: Negative for fever, activity change, appetite change, fatigue and unexpected weight change.  HENT: Negative for ear pain, hearing loss, sore throat and trouble swallowing.   Eyes: Negative for visual disturbance.  Respiratory: Negative for cough and shortness of breath.   Cardiovascular: Negative for chest pain and palpitations.  Gastrointestinal: Positive for abdominal distention. Negative for nausea, vomiting, abdominal pain, diarrhea, constipation and blood in stool.  Endocrine: Negative for polydipsia and polyuria.  Genitourinary: Negative for dysuria and hematuria.  Musculoskeletal: Negative for myalgias, back pain and arthralgias.  Skin: Negative for rash.  Neurological: Negative for dizziness, syncope and headaches.  Hematological: Negative for adenopathy.  Psychiatric/Behavioral: Negative for confusion and dysphoric mood.       Objective:   Physical Exam  Constitutional: She is oriented to person, place, and time. She appears well-developed and well-nourished.  HENT:  Head: Normocephalic and atraumatic.  Eyes: EOM are normal. Pupils are equal, round, and reactive to light.  Neck:  Normal range of motion. Neck supple. No thyromegaly present.  Cardiovascular: Normal rate, regular rhythm and normal heart sounds.   No murmur heard. Pulmonary/Chest: Breath sounds normal. No respiratory distress. She has no wheezes. She has no rales.  Abdominal: Soft. Bowel  sounds are normal. She exhibits no distension and no mass. There is no tenderness. There is no rebound and no guarding.  Genitourinary:  Per GYN  Musculoskeletal: Normal range of motion. She exhibits no edema.  Lymphadenopathy:    She has no cervical adenopathy.  Neurological: She is alert and oriented to person, place, and time. She displays normal reflexes. No cranial nerve deficit.  Skin: No rash noted.  Psychiatric: She has a normal mood and affect. Her behavior is normal. Judgment and thought content normal.          Assessment & Plan:  Physical exam. Labs reviewed. No major concerns. Needs tetanus and she wishes to defer to a later date. She will check on insurance coverage for shingles vaccine. Continue regular exercise with swimming and suggested upper body resistance training as well.  We have suggested that she consider discontinuing very low-dose Benicar as her blood pressure is very well controlled today No indications for pneumonia vaccination until age 63  Eulas Post MD [facility]

## 2016-03-19 ENCOUNTER — Ambulatory Visit (INDEPENDENT_AMBULATORY_CARE_PROVIDER_SITE_OTHER): Payer: 59 | Admitting: Family Medicine

## 2016-03-19 ENCOUNTER — Encounter: Payer: Self-pay | Admitting: Family Medicine

## 2016-03-19 VITALS — BP 116/76 | HR 78

## 2016-03-19 DIAGNOSIS — M216X2 Other acquired deformities of left foot: Secondary | ICD-10-CM

## 2016-03-19 DIAGNOSIS — D1724 Benign lipomatous neoplasm of skin and subcutaneous tissue of left leg: Secondary | ICD-10-CM | POA: Diagnosis not present

## 2016-03-19 NOTE — Patient Instructions (Signed)
Good to see you  Keep doing what you are doing.  Compression calf sleeve. Would be good with walking Use the roller on the muscles around the bone but not on the bone and after exercises Continue the vitamins Try a narrowing of the orthotic like Dr. Valetta Close or something. Cut it so it is at your forefoot.  I would tie the shoe how it feels good.  There are other tests but do not need them right now [employer] for [employer]  Andreas Blower could also be good for his orthotics.  You are doing well overall and hopefully will get better See me again in 6 weeks.

## 2016-03-19 NOTE — Assessment & Plan Note (Signed)
Discussed with patient at great length. We discussed about how to make padding on the over-the-counter orthotics. We discussed a possible new custom orthotics would be necessary but would need more padding. Would consider another provider for this. Encourage her to continue to do the medications we discussed icing regimen. We discussed which activities Which was to avoid. Avoid continuing the barefoot. Please see patient surgeon for greater detail. Patient and will follow-up with me again in 6 weeks. Continued have pain possible ABI and an EMG would be warranted. Spent  25 minutes with patient face-to-face and had greater than 50% of counseling including as described above in assessment and plan.

## 2016-03-19 NOTE — Assessment & Plan Note (Signed)
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 Patient will continue to monitor. If any enlargement we did discuss that this imaging which patient declined today. I do not believe that this is likely contributing to most of her discomfort.

## 2016-03-19 NOTE — Progress Notes (Signed)
[facility] [address] [place], [place] [address] Phone: [phone] Subjective:    I'm seeing this patient by the request  of:  Eulas Post, MD   CC: Left leg pain and swelling Follow-up  [id number] Gabrielle Brock is a 60 y.o. female coming in with complaint of left leg pain and swelling. Patient has had this process for quite some time. Patient is seen multiple different providers for this in [place] as well as here. Patient has been in custom orthotics. Patient has had workup including an MRI. Patient brought report today and this was reviewed. Patient had some mild tendinopathy but on the lateral aspect the ankle over the peroneal tendons as well as mild tenosynovitis of the flexor hallux but otherwise unremarkable. 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 Patient has been transitions to different orthotics. States that this sometimes helps her leg pain but unfortunately causes worsening foot pain. Has been transitioning and changing the orthotics on areolar basis. Having difficulty finding the proper balance. Patient states that if she wears a new over-the-counter orthotics a regular basis she has more numbness in her foot. States that the swelling may be somewhat better. Patient did see a vascular surgeon recently who stated that possible sclerotherapy could be beneficial but not likely.     Past Medical History  Diagnosis Date  . Hypertension   . Depression   . Osteoporosis   . Hyperlipidemia   . Seasonal allergies   . MVP (mitral valve prolapse) ASYMPTOMATIC  . GERD (gastroesophageal reflux disease)   . History of concussion 20 YRS AGO  . Arthritis   . Chronic headaches   . Family history of adverse reaction to anesthesia     father had CABG age 31's and very hard to wake up from surgery  . TMJ (dislocation of temporomandibular joint)    Past Surgical History  Procedure Laterality Date  . Breast  biopsy  2006    RIGHT BREAST -  BENIGN  . Strabismus surgery  AGE 85  . Laser retinal repair left eye  20 YRS AGO  . Hysteroscopy w/d&c  11/27/2011    Procedure: DILATATION AND CURETTAGE /HYSTEROSCOPY;  Surgeon: Margarette Asal, MD;  Location: [facility];  Service: Gynecology;  Laterality: N/A;  . Dilation and curettage of uterus  2013  . Esophagogastroduodenoscopy (egd) with propofol N/A 02/21/2016    Procedure: ESOPHAGOGASTRODUODENOSCOPY (EGD) WITH PROPOFOL;  Surgeon: Arta Silence, MD;  Location: [facility];  Service: Endoscopy;  Laterality: N/A;   Social History   Social History  . Marital Status: Single    Spouse Name: N/A  . Number of Children: N/A  . Years of Education: N/A   Social History Main Topics  . Smoking status: Never Smoker   . Smokeless tobacco: Never Used  . Alcohol Use: Yes     Comment: OCCASIONAL  . Drug Use: No  . Sexual Activity: Not Asked   Other Topics Concern  . None   Social History Narrative   Allergies  Allergen Reactions  . Advil [Ibuprofen] Other (See Comments)    Lips swell  . Cefaclor Hives   Family History  Problem Relation Age of Onset  . Alzheimer's disease Mother   . Parkinsonism Father   . Heart disease Father 51    cabg  . Hyperlipidemia Other   . Hypertension Other   . Heart disease Other  Past medical history, social, surgical and family history all reviewed in electronic medical record.  No pertanent information unless stated regarding to the chief complaint.   Review of Systems: No headache, visual changes, nausea, vomiting, diarrhea, constipation, dizziness, abdominal pain, skin rash, fevers, chills, night sweats, weight loss, swollen lymph nodes, body aches, joint swelling, muscle aches, chest pain, shortness of breath, mood changes.   Objective Blood pressure 116/76, pulse 78, last menstrual period 11/30/2011.  General: No apparent distress alert and oriented x3 mood and affect normal, dressed  appropriately.  HEENT: Pupils equal, extraocular movements intact  Respiratory: Patient's speak in full sentences and does not appear short of breath  Cardiovascular: No lower extremity edema, non tender, no erythema  Skin: Warm dry intact with no signs of infection or rash on extremities or on axial skeleton.  Abdomen: Soft nontender  Neuro: Cranial nerves II through XII are intact, neurovascularly intact in all extremities with 2+ DTRs and 2+ pulses.  Lymph: No lymphadenopathy of posterior or anterior cervical chain or axillae bilaterally.  Gait normal with good balance and coordination.  MSK:  Non tender with full range of motion and good stability and symmetric strength and tone of shoulders, elbows, wrist, hip, knee and ankles bilaterally.  Foot exam shows the patient does have mild overpronation of the hindfoot bilaterally left and right. Patient does have some mild splaying between the first and second toes with hammering over the second and third toes. Significant breakdown of the transverse arch. Neurovascular intact distally.. 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 Minimal tenderness to palpation today.    Impression and Recommendations:     This case required medical decision making of moderate complexity.      Note: This dictation was prepared with Dragon dictation along with smaller phrase technology. Any transcriptional errors that result from this process are unintentional.

## 2016-03-29 ENCOUNTER — Other Ambulatory Visit: Payer: Self-pay | Admitting: Gastroenterology

## 2016-03-29 DIAGNOSIS — R1084 Generalized abdominal pain: Secondary | ICD-10-CM

## 2016-03-29 DIAGNOSIS — R140 Abdominal distension (gaseous): Secondary | ICD-10-CM

## 2016-04-01 ENCOUNTER — Ambulatory Visit
Admission: RE | Admit: 2016-04-01 | Discharge: 2016-04-01 | Disposition: A | Discharge status: Home / Self Care | Payer: 59 | Source: Ambulatory Visit | Attending: Gastroenterology | Admitting: Gastroenterology

## 2016-04-01 DIAGNOSIS — R1084 Generalized abdominal pain: Secondary | ICD-10-CM

## 2016-04-01 DIAGNOSIS — R140 Abdominal distension (gaseous): Secondary | ICD-10-CM

## 2016-04-01 MED ORDER — IOPAMIDOL (ISOVUE-300) INJECTION 61%
100.0000 mL | Freq: Once | INTRAVENOUS | Status: AC | PRN
  Administered 2016-04-01: 100 mL via INTRAVENOUS

## 2016-04-02 ENCOUNTER — Other Ambulatory Visit: Payer: 59

## 2016-05-07 ENCOUNTER — Ambulatory Visit: Payer: 59 | Admitting: Family Medicine

## 2016-05-13 ENCOUNTER — Encounter: Payer: Self-pay | Admitting: Family Medicine

## 2016-05-13 ENCOUNTER — Other Ambulatory Visit: Payer: Self-pay

## 2016-05-13 ENCOUNTER — Ambulatory Visit (INDEPENDENT_AMBULATORY_CARE_PROVIDER_SITE_OTHER): Payer: 59 | Admitting: Family Medicine

## 2016-05-13 VITALS — HR 76 | Wt 108.0 lb

## 2016-05-13 DIAGNOSIS — M79672 Pain in left foot: Secondary | ICD-10-CM

## 2016-05-13 DIAGNOSIS — S5401XA Injury of ulnar nerve at forearm level, right arm, initial encounter: Secondary | ICD-10-CM

## 2016-05-13 DIAGNOSIS — S5400XA Injury of ulnar nerve at forearm level, unspecified arm, initial encounter: Secondary | ICD-10-CM | POA: Insufficient documentation

## 2016-05-13 DIAGNOSIS — M216X2 Other acquired deformities of left foot: Secondary | ICD-10-CM | POA: Diagnosis not present

## 2016-05-13 DIAGNOSIS — S92912A Unspecified fracture of left toe(s), initial encounter for closed fracture: Secondary | ICD-10-CM | POA: Diagnosis not present

## 2016-05-13 MED ORDER — GABAPENTIN 100 MG PO CAPS: 100.0000 mg | ORAL_CAPSULE | Freq: Every day | ORAL | Status: DC

## 2016-05-13 NOTE — Assessment & Plan Note (Signed)
Encouraged her orthotics.

## 2016-05-13 NOTE — Assessment & Plan Note (Signed)
Discussed with patient about proper shoes, icing, topical anti-inflammatories. We discussed rigid soles shoes would help at this moment. Avoid being barefoot. Patient will follow-up again in 3 weeks to make sure completely responding. Does appear to have a cortical defect that is having a soft callus formation at this time on ultrasound.

## 2016-05-13 NOTE — Assessment & Plan Note (Signed)
I believe the patient did have more of an ulnar nerve injury. We discussed icing regimen, home exercises, which activities to do in which ones to potentially avoid. We discussed proper lifting mechanics. We discussed the possibility of compression. Patient try topical anti-inflammatories and a short course of oral anti-inflammatories. We discussed icing regimen. We discussed avoiding direct contact with the area. Follow-up again in 3-4 weeks if not completely resolved.

## 2016-05-13 NOTE — Progress Notes (Signed)
[name] [place], [place] [address] Phone: [phone] Subjective:     CC: Left toe injury  [id number] Gabrielle Brock is a 60 y.o. female coming in with complaint of Left toe injury. Patient dropped a suitcase on her toe 2 weeks ago. States that it slowly improving but unfortunately gives her more pain. Did have redness and some mild bruising initially. Patient denies any radiation of pain. Any numbness. Has had difficulty with her feet previously. Not wearing the orthotics as regularly as she will should be.  Patient is also complaining of right elbow pain. Went and tried to pull her suitcase. Had pain immediately. Since then more of a dull aching pain that's worse with any type of compression in the area. Patient states that she is not tried any other home modalities. Only affects certain positions and not every time she does the same movement and hurts her.     Past Medical History  Diagnosis Date  . Hypertension   . Depression   . Osteoporosis   . Hyperlipidemia   . Seasonal allergies   . MVP (mitral valve prolapse) ASYMPTOMATIC  . GERD (gastroesophageal reflux disease)   . History of concussion 20 YRS AGO  . Arthritis   . Chronic headaches   . Family history of adverse reaction to anesthesia     father had CABG age 23's and very hard to wake up from surgery  . TMJ (dislocation of temporomandibular joint)    Past Surgical History  Procedure Laterality Date  . Breast biopsy  2006    RIGHT BREAST -  BENIGN  . Strabismus surgery  AGE 66  . Laser retinal repair left eye  20 YRS AGO  . Hysteroscopy w/d&c  11/27/2011    Procedure: DILATATION AND CURETTAGE /HYSTEROSCOPY;  Surgeon: Margarette Asal, MD;  Location: [facility];  Service: Gynecology;  Laterality: N/A;  . Dilation and curettage of uterus  2013  . Esophagogastroduodenoscopy (egd) with propofol N/A 02/21/2016    Procedure: ESOPHAGOGASTRODUODENOSCOPY (EGD)  WITH PROPOFOL;  Surgeon: Arta Silence, MD;  Location: [facility];  Service: Endoscopy;  Laterality: N/A;   Social History   Social History  . Marital Status: Single    Spouse Name: N/A  . Number of Children: N/A  . Years of Education: N/A   Social History Main Topics  . Smoking status: Never Smoker   . Smokeless tobacco: Never Used  . Alcohol Use: Yes     Comment: OCCASIONAL  . Drug Use: No  . Sexual Activity: Not Asked   Other Topics Concern  . None   Social History Narrative   Allergies  Allergen Reactions  . Advil [Ibuprofen] Other (See Comments)    Lips swell  . Cefaclor Hives   Family History  Problem Relation Age of Onset  . Alzheimer's disease Mother   . Parkinsonism Father   . Heart disease Father 26    cabg  . Hyperlipidemia Other   . Hypertension Other   . Heart disease Other     Past medical history, social, surgical and family history all reviewed in electronic medical record.  No pertanent information unless stated regarding to the chief complaint.   Review of Systems: No headache, visual changes, nausea, vomiting, diarrhea, constipation, dizziness, abdominal pain, skin rash, fevers, chills, night sweats, weight loss, swollen lymph nodes, body aches, joint swelling, muscle aches, chest pain, shortness of breath, mood changes.   Objective Pulse 76, weight 108  lb (48.988 kg), last menstrual period 11/30/2011.  General: No apparent distress alert and oriented x3 mood and affect normal, dressed appropriately.  HEENT: Pupils equal, extraocular movements intact  Respiratory: Patient's speak in full sentences and does not appear short of breath  Cardiovascular: No lower extremity edema, non tender, no erythema  Skin: Warm dry intact with no signs of infection or rash on extremities or on axial skeleton.  Abdomen: Soft nontender  Neuro: Cranial nerves II through XII are intact, neurovascularly intact in all extremities with 2+ DTRs and 2+ pulses.  Lymph:  No lymphadenopathy of posterior or anterior cervical chain or axillae bilaterally.  Gait normal with good balance and coordination.  MSK:  Non tender with full range of motion and good stability and symmetric strength and tone of shoulders,  wrist, hip, knee and ankles bilaterally.  Elbow: Bilateral Unremarkable to inspection. Range of motion full pronation, supination, flexion, extension. Strength is full to all of the above directions Stable to varus, valgus stress. Negative moving valgus stress test. On the right side patient does have some tenderness over the medial epicondylar region. Mild signs of subluxation of the ulnar nerve with radicular symptoms going down the arm towards the hand. No weakness. Negative cubital tunnel Tinel's.   Foot exam shows the patient does have significant neural feet bilaterally. Significant pes planus as well as overpronation the hindfoot. Patient does have bunion and bunionette formation. Patient is tender to palpation over the left first toe. Very mild. Hallux rigidus noted.  Limited musculoskeletal ultrasound was performed and interpreted by Hulan Saas, M  Limited ultrasound performed by me shows a healing callus formation is proximal to the IP joint. No significant dysplasia. No angulation. Minimal hypoechoic changes still remaining. Impression: Healing first MTP fracture   Impression and Recommendations:     This case required medical decision making of moderate complexity.      Note: This dictation was prepared with Dragon dictation along with smaller phrase technology. Any transcriptional errors that result from this process are unintentional.

## 2016-05-13 NOTE — Patient Instructions (Signed)
Good to se eyou  pennsaid pinkie amount topically 2 times daily as needed to right elbow Lift overhand or thumbs up only.  Avoid heavy lifting for 2 weeks.  Avoid placing it on hard objects, use a pillow if needed.  Gabapentin 100mg  at night can help the nerve as well.  For the left elbow try eucerin or aveeno 2 times daily  For the foot  Continue the vitamin D Rigid sole shoes with large toe box would be the best.  Also no heels for now.  Will take 2-3 weeks Would like biking or elliptical instead of walking.  Continue with the pool See me again in 4 weeks

## 2016-07-18 ENCOUNTER — Encounter: Payer: Self-pay | Admitting: Family Medicine

## 2016-07-19 ENCOUNTER — Other Ambulatory Visit: Payer: Self-pay | Admitting: *Deleted

## 2016-07-19 MED ORDER — ZOLPIDEM TARTRATE 5 MG PO TABS: 5.0000 mg | ORAL_TABLET | Freq: Every evening | ORAL | 0 refills | Status: DC | PRN

## 2016-07-19 NOTE — Telephone Encounter (Signed)
Rx done and the pt was notified via Mychart message. 

## 2016-09-20 ENCOUNTER — Encounter: Payer: Self-pay | Admitting: Family Medicine

## 2016-10-07 ENCOUNTER — Encounter: Payer: Self-pay | Admitting: Family Medicine

## 2016-10-09 ENCOUNTER — Ambulatory Visit (INDEPENDENT_AMBULATORY_CARE_PROVIDER_SITE_OTHER): Payer: 59 | Admitting: Family Medicine

## 2016-10-09 ENCOUNTER — Other Ambulatory Visit: Payer: Self-pay

## 2016-10-09 VITALS — BP 130/92 | HR 103 | Temp 98.4°F | Ht 62.0 in | Wt 108.0 lb

## 2016-10-09 DIAGNOSIS — J029 Acute pharyngitis, unspecified: Secondary | ICD-10-CM | POA: Diagnosis not present

## 2016-10-09 MED ORDER — PANTOPRAZOLE SODIUM 40 MG PO TBEC: 40.0000 mg | DELAYED_RELEASE_TABLET | Freq: Every day | ORAL | 3 refills | Status: DC

## 2016-10-09 MED ORDER — RANITIDINE HCL 150 MG PO TABS: 150.0000 mg | ORAL_TABLET | Freq: Two times a day (BID) | ORAL | Status: DC

## 2016-10-09 NOTE — Progress Notes (Signed)
Pre visit review using our clinic review tool, if applicable. No additional management support is needed unless otherwise documented below in the visit note. 

## 2016-10-09 NOTE — Progress Notes (Signed)
Subjective:     Patient ID: Gabrielle Brock, female   DOB: 1955-12-18, 60 y.o.   MRN: [id number]  HPI Patient seen with one-day history of sore throat. She's had some chronic GI complaints and frequent reflux symptoms and this had extensive workup. She just had nasolaryngoscopy last week per ENT which apparently showed some reflux related changes and she was switched recently by her GI physician from Nexium to Protonix which she is taking twice daily as well as Zantac supplement at night. She has recently reduced her coffee intake. Denies any fever or chills. No postnasal drip symptoms.  She states she frequently has a "rumbling sensation "upper abdomen. Denies any burning pain. No difficulty swallowing. No pain with swallowing. She avoids eating within a few hours of bedtime. Sleeps on an extra pillow. Had previous gastric imaging study which showed no evidence for gastroparesis. No recent appetite or weight changes. No stool changes.  Past Medical History:  Diagnosis Date  . Arthritis   . Chronic headaches   . Depression   . Family history of adverse reaction to anesthesia    father had CABG age 96's and very hard to wake up from surgery  . GERD (gastroesophageal reflux disease)   . History of concussion 20 YRS AGO  . Hyperlipidemia   . Hypertension   . MVP (mitral valve prolapse) ASYMPTOMATIC  . Osteoporosis   . Seasonal allergies   . TMJ (dislocation of temporomandibular joint)    Past Surgical History:  Procedure Laterality Date  . BREAST BIOPSY  2006   RIGHT BREAST -  BENIGN  . DILATION AND CURETTAGE OF UTERUS  2013  . ESOPHAGOGASTRODUODENOSCOPY (EGD) WITH PROPOFOL N/A 02/21/2016   Procedure: ESOPHAGOGASTRODUODENOSCOPY (EGD) WITH PROPOFOL;  Surgeon: Arta Silence, MD;  Location: [facility];  Service: Endoscopy;  Laterality: N/A;  . HYSTEROSCOPY W/D&C  11/27/2011   Procedure: DILATATION AND CURETTAGE /HYSTEROSCOPY;  Surgeon: Margarette Asal, MD;  Location: [facility];  Service: Gynecology;  Laterality: N/A;  . LASER RETINAL REPAIR LEFT EYE  20 YRS AGO  . STRABISMUS SURGERY  AGE 51    reports that she has never smoked. She has never used smokeless tobacco. She reports that she drinks alcohol. She reports that she does not use drugs. family history includes Alzheimer's disease in her mother; Heart disease in her other; Heart disease (age of onset: 16) in her father; Hyperlipidemia in her other; Hypertension in her other; Parkinsonism in her father. Allergies  Allergen Reactions  . Advil [Ibuprofen] Other (See Comments)    Lips swell  . Cefaclor Hives     Review of Systems  Constitutional: Negative for chills and fever.  HENT: Positive for sore throat.   Respiratory: Negative for cough and shortness of breath.   Cardiovascular: Negative for chest pain.  Gastrointestinal: Negative for blood in stool, constipation, nausea and vomiting.       Objective:   Physical Exam  Constitutional: She appears well-developed and well-nourished.  HENT:  Minimal erythema right posterior pharynx. No exudate.  Neck: Neck supple.  Cardiovascular: Normal rate and regular rhythm.   Pulmonary/Chest: Effort normal and breath sounds normal. No respiratory distress. She has no wheezes. She has no rales.  Abdominal: Soft. Bowel sounds are normal. She exhibits no distension and no mass. There is no tenderness. There is no rebound and no guarding.  Lymphadenopathy:    She has no cervical adenopathy.       Assessment:     Mild sore throat.  Differential is viral versus PND vs other.  Doubt GERD related    Plan:     -Follow-up for any fever or worsening symptoms -Continue close follow-up with GI regarding her GERD regimen -discussed lifestyle management of GERD. -consider elevate head of bed 6 to 8 inches.  Eulas Post MD [facility]

## 2016-10-10 ENCOUNTER — Encounter: Payer: Self-pay | Admitting: Family Medicine

## 2016-10-10 NOTE — Telephone Encounter (Signed)
Dr. Clearence Cheek

## 2016-10-11 ENCOUNTER — Other Ambulatory Visit: Payer: Self-pay

## 2016-10-11 MED ORDER — OLMESARTAN MEDOXOMIL 5 MG PO TABS: ORAL_TABLET | ORAL | 3 refills | Status: AC

## 2016-10-14 ENCOUNTER — Encounter: Payer: Self-pay | Admitting: Family Medicine

## 2016-10-15 ENCOUNTER — Encounter: Payer: Self-pay | Admitting: Family Medicine

## 2016-10-15 ENCOUNTER — Ambulatory Visit (INDEPENDENT_AMBULATORY_CARE_PROVIDER_SITE_OTHER): Payer: 59 | Admitting: Family Medicine

## 2016-10-15 ENCOUNTER — Other Ambulatory Visit: Payer: Self-pay | Admitting: Family Medicine

## 2016-10-15 VITALS — BP 124/68 | HR 80 | Temp 97.8°F | Ht 62.0 in | Wt 107.0 lb

## 2016-10-15 DIAGNOSIS — J069 Acute upper respiratory infection, unspecified: Secondary | ICD-10-CM

## 2016-10-15 DIAGNOSIS — J989 Respiratory disorder, unspecified: Secondary | ICD-10-CM | POA: Insufficient documentation

## 2016-10-15 DIAGNOSIS — R0989 Other specified symptoms and signs involving the circulatory and respiratory systems: Secondary | ICD-10-CM

## 2016-10-15 DIAGNOSIS — B9789 Other viral agents as the cause of diseases classified elsewhere: Secondary | ICD-10-CM

## 2016-10-15 MED ORDER — PREDNISONE 20 MG PO TABS: ORAL_TABLET | ORAL | 1 refills | Status: DC

## 2016-10-15 MED ORDER — HYDROCODONE-HOMATROPINE 5-1.5 MG/5ML PO SYRP: 5.0000 mL | ORAL_SOLUTION | Freq: Three times a day (TID) | ORAL | 0 refills | Status: DC | PRN

## 2016-10-15 NOTE — Progress Notes (Signed)
Gabrielle Brock is a 60 year old female nonsmoker who comes in today for evaluation of 3 issues  The most important issue is a week ago she developed a cough. She had a fever for 1 day. She travels a lot in her job. She been an airplane in November 29. The cough this persisted then yesterday she noticed some wheezing. This happened to her year ago when she went to the [facility] and saw Dr. Jenny Reichmann who put her on antibiotics and prednisone.  She also has a history of allergic rhinitis.  She's never had chest trouble with her airways in the past except for underlying allergic rhinitis.  She recently saw an ENT because of sensation of something getting stuck in her throat. Endoscopy showed evidence of chronic reflux. She was referred back to her GI. She says she's also noticed a rapid heart rate but she thinks it may be stress.  BP 124/68 (BP Location: Left Arm, Patient Position: Sitting, Cuff Size: Normal)   Pulse 80   Temp 97.8 F (36.6 C) (Oral)   Ht 5\' 2"  (1.575 m)   Wt 107 lb (48.5 kg)   LMP 11/30/2011   BMI 19.57 kg/m  Examination of the HEENT were negative neck was supple no adenopathy thyroid normal pulmonary exam shows respiratory B 12 and unlabored. Symmetrical breath sounds. Mild wheezing symmetrically inspiratory and expiratory. No crackles  Viral syndrome with secondary asthma.................Marland Kitchen prednisone burst and taper and Hydromet  Chronic reflux esophagitis referred back to GI per ENT  Normal cardiac exam

## 2016-10-15 NOTE — Progress Notes (Signed)
Pre visit review using our clinic review tool, if applicable. No additional management support is needed unless otherwise documented below in the visit note. 

## 2016-10-15 NOTE — Patient Instructions (Signed)
Drink lots of water  Do not take any the over-the-counter cough and cold medicines.......Marland Kitchen Mucinex etc. etc.  Prednisone 20 mg...... 2 tabs 3 days or until you see significant decrease in your cough...... then taper as outlined  Hydromet............Marland Kitchen 1/2-1 teaspoon 3 times daily when necessary for cough

## 2016-10-15 NOTE — Telephone Encounter (Signed)
Pt saw Dr Sherren Mocha today. Pt states she is leaving for a 9 day trip on Friday and is not feeling well. Pt would like to take/bring along a zpak with her on this trip, fjust in case she doesn't get better.  Please call cell or home. [facility] [address]

## 2016-10-16 ENCOUNTER — Encounter: Payer: Self-pay | Admitting: Family Medicine

## 2016-10-16 NOTE — Telephone Encounter (Signed)
Pt is aware of annotations and agreeable.

## 2016-10-16 NOTE — Telephone Encounter (Signed)
Would not recommend any antibiotic at this time.  We would not expect cough to be better yet.  If she has any fever or other change of symptoms let us know.  If she is still wheezing, would continue with the prednisone.  Stay well hydrated and let her body fight this as much as possible.

## 2016-10-16 NOTE — Telephone Encounter (Signed)
Dr. Burchette - Please advise. Thanks! 

## 2016-10-16 NOTE — Telephone Encounter (Signed)
Pt has not heard anything after sending a couple of messages via mychart, pt is very frustrated.  Pt reports not any better and still coughing the same. Pt also reports a "slight little bit" of blurry vision. Would like to know should she continue the prednisone? Pt really prefers to have a zpak.  [facility]

## 2016-10-29 ENCOUNTER — Encounter: Payer: Self-pay | Admitting: Family Medicine

## 2016-11-03 ENCOUNTER — Encounter: Payer: Self-pay | Admitting: Family Medicine

## 2016-11-06 ENCOUNTER — Ambulatory Visit (INDEPENDENT_AMBULATORY_CARE_PROVIDER_SITE_OTHER): Payer: 59 | Admitting: Family Medicine

## 2016-11-06 VITALS — BP 130/70 | HR 103 | Temp 98.2°F | Ht 62.0 in | Wt 106.0 lb

## 2016-11-06 DIAGNOSIS — R0789 Other chest pain: Secondary | ICD-10-CM | POA: Diagnosis not present

## 2016-11-06 NOTE — Patient Instructions (Signed)
Suspect musculoskeletal origin of chest wall pain Follow up for any fever or increased shortness of breath. Try some heat topically.

## 2016-11-06 NOTE — Progress Notes (Signed)
Pre visit review using our clinic review tool, if applicable. No additional management support is needed unless otherwise documented below in the visit note. 

## 2016-11-06 NOTE — Progress Notes (Signed)
Subjective:     Patient ID: Gabrielle Brock, female   DOB: [birth year], 61 y.o.   MRN: [id number]  HPI Seen with some recent left sided chest wall pain.  Back in December she developed some cough and wheezing.  Placed on prednisone here. Cough continued and was seen up in [place] the following week and CXR reportedly clear and pt placed on Zithromax.  She has some chest wall pain.  Worse with position change such as lying down. No fever or chills.  No hemoptysis or dyspnea.  She tried some topical Diclofenac gel with minimal relief.  Past Medical History:  Diagnosis Date  . Arthritis   . Chronic headaches   . Depression   . Family history of adverse reaction to anesthesia    father had CABG age 68's and very hard to wake up from surgery  . GERD (gastroesophageal reflux disease)   . History of concussion 20 YRS AGO  . Hyperlipidemia   . Hypertension   . MVP (mitral valve prolapse) ASYMPTOMATIC  . Osteoporosis   . Seasonal allergies   . TMJ (dislocation of temporomandibular joint)    Past Surgical History:  Procedure Laterality Date  . BREAST BIOPSY  2006   RIGHT BREAST -  BENIGN  . DILATION AND CURETTAGE OF UTERUS  2013  . ESOPHAGOGASTRODUODENOSCOPY (EGD) WITH PROPOFOL N/A 02/21/2016   Procedure: ESOPHAGOGASTRODUODENOSCOPY (EGD) WITH PROPOFOL;  Surgeon: Arta Silence, MD;  Location: [facility];  Service: Endoscopy;  Laterality: N/A;  . HYSTEROSCOPY W/D&C  11/27/2011   Procedure: DILATATION AND CURETTAGE /HYSTEROSCOPY;  Surgeon: Margarette Asal, MD;  Location: [facility];  Service: Gynecology;  Laterality: N/A;  . LASER RETINAL REPAIR LEFT EYE  20 YRS AGO  . STRABISMUS SURGERY  AGE 61    reports that she has never smoked. She has never used smokeless tobacco. She reports that she drinks alcohol. She reports that she does not use drugs. family history includes Alzheimer's disease in her mother; Heart disease in her other; Heart disease (age of onset: 57) in her  father; Hyperlipidemia in her other; Hypertension in her other; Parkinsonism in her father. Allergies  Allergen Reactions  . Advil [Ibuprofen] Other (See Comments)    Lips swell  . Cefaclor Hives     Review of Systems  Constitutional: Negative for chills and fever.  Respiratory: Negative for cough, shortness of breath and wheezing.   Cardiovascular: Negative for palpitations and leg swelling.  Skin: Negative for rash.  Neurological: Negative for weakness.       Objective:   Physical Exam  Constitutional: She appears well-developed and well-nourished.  Cardiovascular: Normal rate and regular rhythm.   Pulmonary/Chest: Effort normal and breath sounds normal. No respiratory distress. She has no wheezes. She has no rales.  Slightly tender to palpation left chest wall anterior axillary line around 5 th to 6 th rib.  Musculoskeletal: She exhibits no edema.       Assessment:     Left sided chest wall pain following recent cough.  Suspect musculoskeletal.    Plan:     -continue with topical such as Diclofenac -could get follow up X-ray- but discussed even if hairline fracture of rib would not change management. -follow up for any fever or increased shortness of breath.  Eulas Post MD [facility] at [facility]

## 2016-11-08 ENCOUNTER — Ambulatory Visit: Payer: 59 | Admitting: Family Medicine

## 2016-11-15 ENCOUNTER — Encounter: Payer: Self-pay | Admitting: Family Medicine

## 2016-12-03 ENCOUNTER — Encounter: Payer: Self-pay | Admitting: Internal Medicine

## 2016-12-03 ENCOUNTER — Ambulatory Visit (INDEPENDENT_AMBULATORY_CARE_PROVIDER_SITE_OTHER): Payer: 59 | Admitting: Internal Medicine

## 2016-12-03 ENCOUNTER — Ambulatory Visit (INDEPENDENT_AMBULATORY_CARE_PROVIDER_SITE_OTHER)
Admission: RE | Admit: 2016-12-03 | Discharge: 2016-12-03 | Disposition: A | Discharge status: Home / Self Care | Payer: 59 | Source: Ambulatory Visit | Attending: Internal Medicine | Admitting: Internal Medicine

## 2016-12-03 VITALS — BP 130/80 | HR 104 | Ht 62.0 in | Wt 107.0 lb

## 2016-12-03 DIAGNOSIS — R05 Cough: Secondary | ICD-10-CM

## 2016-12-03 DIAGNOSIS — R059 Cough, unspecified: Secondary | ICD-10-CM

## 2016-12-03 NOTE — Progress Notes (Signed)
Spoke with pt and notified of results per Dr. Wert. Pt verbalized understanding and denied any questions. 

## 2016-12-03 NOTE — Progress Notes (Signed)
Subjective:     Patient ID: Gabrielle Brock, female   DOB: 06-10-1956,   MRN: [id number]  HPI  31 yowf never smoker with h/o djd/ hbp/ gerd acutely ill Jan 2017  With cough /wheezing (new)  Resolved completely then onset xOct 2017  ST llasted 2 m dx as gerd by ent rx elevate HOB only rec then acutely ill Dec 14th  And on 12/19 2017 with similar illness as in Jan 2017 assoc with cough/wheeze > [place] eval for [facility] rx prednisone but still had sensation of pnds so self referred to pulmonary clinic 12/03/2016     12/03/2016 1st [facility] office visit/ Gabrielle Brock   Chief Complaint  Patient presents with  . Pulmonary Consult    Self referral. Pt c/o "mucus in my throat"- started in mid Dec 2017.  She also c/o CP with exertion.    following the treatment at [facility] 10/15/16  ended I[ in [place] still coughing a lot so seen Oct 19 2016 rx zpak/ proaire/prednisone  with neg cxr > symptoms completely resolved and went back to swimming then started with L chest the same  night 90% improved p rx celebrex still some positional discomfort for example pushing a door open and then recurrent cough started 2 weeks prior to OV seems worse before supper and not typically waking her up mostly dry with sore throat or something "stuck in my throat" but no excess/ purulent sputum or mucus plugs    No obvious  Patterns to day to day or daytime variability or assoc  chest tightness, subjective wheeze or overt sinus or hb symptoms. No unusual exp hx or h/o childhood pna/ asthma or knowledge of premature birth.  Sleeping ok without nocturnal  or early am exacerbation  of respiratory  c/o's or need for noct saba. Also denies any obvious fluctuation of symptoms with weather or environmental changes or other aggravating or alleviating factors except as outlined above   Current Medications, Allergies, Complete Past Medical History, Past Surgical History, Family History, and Social History were reviewed in [employer] record.  ROS  The following are not active complaints unless bolded sore throat, dysphagia, dental problems, itching, sneezing,  nasal congestion or excess/ purulent secretions, ear ache,   fever, chills, sweats, unintended wt loss, classically pleuritic or exertional cp, hemoptysis,  orthopnea pnd or leg swelling, presyncope, palpitations, abdominal pain, anorexia, nausea, vomiting, diarrhea  or change in bowel or bladder habits, change in stools or urine, dysuria,hematuria,  rash, arthralgias, visual complaints, headache, numbness, weakness or ataxia or problems with walking or coordination,  change in mood/affect or memory.           Review of Systems     Objective:   Physical Exam     Pleasant amb wf nad extremely difficult hx with push of speech, tangential thought pattern, multiple re-directs needed   Wt Readings from Last 3 Encounters:  12/03/16 107 lb (48.5 kg)  11/06/16 106 lb (48.1 kg)  10/15/16 107 lb (48.5 kg)    Vital signs reviewed .- Note on arrival 02 sats  100% on RA     HEENT: nl dentition, turbinates, and oropharynx. Nl external ear canals without cough reflex   NECK :  without JVD/Nodes/TM/ nl carotid upstrokes bilaterally   LUNGS: no acc muscle use,  Nl contour chest which is clear to A and P bilaterally without cough on insp or exp maneuvers   CV:  RRR  no s3 or murmur or increase  in P2, nad no edema   ABD:  soft and nontender with nl inspiratory excursion in the supine position. No bruits or organomegaly appreciated, bowel sounds nl  MS:  Nl gait/ ext warm without deformities, calf tenderness, cyanosis or clubbing No obvious joint restrictions   SKIN: warm and dry without lesions    NEURO:  alert, approp, nl sensorium with  no motor or cerebellar deficits apparent.    CXR PA and Lateral:   12/03/2016 :    I personally reviewed images and agree with radiology impression as follows:   No active cardiopulmonary disease.  No change from  [employer].       Assessment:

## 2016-12-03 NOTE — Patient Instructions (Addendum)
Nexium 40 mg Take 30-60 min before first meal of the day and pepcid 20 mg either after supper or bedtime   GERD (REFLUX)  is an extremely common cause of respiratory symptoms just like yours , many times with no obvious heartburn at all.    It can be treated with medication, but also with lifestyle changes including elevation of the head of your bed (ideally with 6 inch  bed blocks),  Smoking cessation, avoidance of late meals, excessive alcohol, and avoid fatty foods, chocolate, peppermint, colas, red wine, and acidic juices such as orange juice.  NO MINT OR MENTHOL PRODUCTS SO NO COUGH DROPS  USE SUGARLESS CANDY INSTEAD (Jolley ranchers or Stover's or Life Savers) or even ice chips will also do - the key is to swallow to prevent all throat clearing. NO OIL BASED VITAMINS - use powdered substitutes.  For cough mucinex (dm)  600 mg 1-2 every 12 hours  If not better the next step is sinus CT - call [facility] at [phone]

## 2016-12-04 NOTE — Assessment & Plan Note (Signed)
Recurrent pattern since 10/2015 with suspected GERD by ENT eval fall 2017 and flare off GERD rx with ? URI Dec 2017 >  pulm eval 12/03/2016 rec max gerd rx/ then do sinus CT next   The most common causes of chronic cough in immunocompetent adults include the following: upper airway cough syndrome (UACS), previously referred to as postnasal drip syndrome (PNDS), which is caused by variety of rhinosinus conditions; (2) asthma; (3) GERD; (4) chronic bronchitis from cigarette smoking or other inhaled environmental irritants; (5) nonasthmatic eosinophilic bronchitis; and (6) bronchiectasis.   These conditions, singly or in combination, have accounted for up to 94% of the causes of chronic cough in prospective studies.   Other conditions have constituted no >6% of the causes in prospective studies These have included bronchogenic carcinoma, chronic interstitial pneumonia, sarcoidosis, left ventricular failure, ACEI-induced cough, and aspiration from a condition associated with pharyngeal dysfunction.    Chronic cough is often simultaneously caused by more than one condition. A single cause has been found from 38 to 82% of the time, multiple causes from 18 to 62%. Multiply caused cough has been the result of three diseases up to 42% of the time.       Based on hx and exam, this is most likely:  Classic Upper airway cough syndrome, so named because it's frequently impossible to sort out how much is  CR/sinusitis with freq throat clearing (which can be related to primary GERD)   vs  causing  secondary (" extra esophageal")  GERD from wide swings in gastric pressure that occur with throat clearing, often  promoting self use of mint and menthol lozenges that reduce the lower esophageal sphincter tone and exacerbate the problem further in a cyclical fashion.   These are the same pts (now being labeled as having "irritable larynx syndrome" by some cough centers) who not infrequently have a history of having failed to  tolerate ace inhibitors,  dry powder inhalers or biphosphonates or report having atypical reflux symptoms that don't respond to standard doses of PPI , and are easily confused as having aecopd or asthma flares by even experienced allergists/ pulmonologists.   Of the three most common causes of chronic cough, only one (GERD)  can actually cause the other two (asthma and post nasal drip syndrome)  and perpetuate the cylce of cough inducing airway trauma, inflammation, heightened sensitivity to reflux which is prompted by the cough itself via a cyclical mechanism.    This may partially respond to steroids and look like asthma and post nasal drainage but never erradicated completely unless the cough and the secondary reflux are eliminated, preferably both at the same time.  While not intuitively obvious, many patients with chronic low grade reflux do not cough until there is a secondary insult that disturbs the protective epithelial barrier and exposes sensitive nerve endings.  This can be viral URI (as is likely the case here) or direct physical injury such as with an endotracheal tube.   The point is that once this occurs, it is difficult to eliminate using anything but a maximally effective acid suppression regimen at least in the short run, accompanied by an appropriate diet to address non acid GERD.   The first step is to maximize acid suppression and eliminate cyclical coughing then regroup if the cough persists with sinus CT first .  Total time devoted to counseling  > 50 % of initial 60 min office visit:  review case with pt/ discussion of options/alternatives/ personally creating  written customized instructions  in presence of pt  then going over those specific  Instructions directly with the pt including how to use all of the meds but in particular covering each new medication in detail and the difference between the maintenance= "automatic" meds and the prns using an action plan format for the  latter (If this problem/symptom => do that organization reading Left to right).  Please see AVS from this visit for a full list of these instructions which I personally wrote for this pt and  are unique to this visit.

## 2016-12-06 ENCOUNTER — Encounter: Payer: Self-pay | Admitting: Internal Medicine

## 2016-12-06 NOTE — Telephone Encounter (Signed)
MW please advise.  Thanks.  

## 2016-12-12 ENCOUNTER — Telehealth: Payer: Self-pay | Admitting: Internal Medicine

## 2016-12-12 ENCOUNTER — Encounter: Payer: Self-pay | Admitting: Internal Medicine

## 2016-12-12 DIAGNOSIS — R059 Cough, unspecified: Secondary | ICD-10-CM

## 2016-12-12 DIAGNOSIS — R05 Cough: Secondary | ICD-10-CM

## 2016-12-12 NOTE — Telephone Encounter (Signed)
MW please advise.  Thanks.  

## 2016-12-12 NOTE — Telephone Encounter (Signed)
Patient calling back stating if call within the next 2 hours, please call [phone].

## 2016-12-12 NOTE — Telephone Encounter (Signed)
Spoke with pt. She is needing get a CT of her sinus scheduled as she is not feeling any better. Per MW's last OV, pt was call us to set this up. Order has been placed. Nothing further was needed.

## 2016-12-12 NOTE — Telephone Encounter (Signed)
Pt responded to Lindsay's e-mail thanking her for her quick reply.  Pt also stated that her right eye has been watering but she will contact her dentist.  Will sign off.

## 2016-12-12 NOTE — Telephone Encounter (Signed)
Yes the ct covers the mouth and throat but this is probably something she should have her dentist look at also   Limited sinus CT is all we need to order

## 2016-12-13 ENCOUNTER — Telehealth: Payer: Self-pay | Admitting: Internal Medicine

## 2016-12-13 NOTE — Telephone Encounter (Signed)
Spoke to pt she is aware the scan has been approved however the insurance company is trying to reqch er I gave her the # to call Joellen Jersey

## 2016-12-16 ENCOUNTER — Ambulatory Visit (INDEPENDENT_AMBULATORY_CARE_PROVIDER_SITE_OTHER)
Admission: RE | Admit: 2016-12-16 | Discharge: 2016-12-16 | Disposition: A | Discharge status: Home / Self Care | Payer: 59 | Source: Ambulatory Visit | Attending: Internal Medicine | Admitting: Internal Medicine

## 2016-12-16 DIAGNOSIS — R05 Cough: Secondary | ICD-10-CM

## 2016-12-16 DIAGNOSIS — R059 Cough, unspecified: Secondary | ICD-10-CM

## 2016-12-17 ENCOUNTER — Encounter: Payer: Self-pay | Admitting: Internal Medicine

## 2016-12-17 NOTE — Progress Notes (Signed)
Spoke with pt and notified of results per Dr. Wert. Pt verbalized understanding and denied any questions. 

## 2016-12-17 NOTE — Progress Notes (Signed)
lmtcb

## 2016-12-17 NOTE — Telephone Encounter (Signed)
MW please advise to above message. Thanks.

## 2016-12-18 ENCOUNTER — Other Ambulatory Visit: Payer: 59

## 2016-12-20 ENCOUNTER — Ambulatory Visit: Payer: 59 | Admitting: Internal Medicine

## 2017-01-03 ENCOUNTER — Encounter: Payer: Self-pay | Admitting: Family Medicine

## 2017-01-03 ENCOUNTER — Ambulatory Visit (INDEPENDENT_AMBULATORY_CARE_PROVIDER_SITE_OTHER): Payer: 59 | Admitting: Family Medicine

## 2017-01-03 VITALS — BP 148/84 | HR 68 | Temp 98.3°F | Wt 111.1 lb

## 2017-01-03 DIAGNOSIS — R208 Other disturbances of skin sensation: Secondary | ICD-10-CM | POA: Diagnosis not present

## 2017-01-03 NOTE — Progress Notes (Signed)
Pre visit review using our clinic review tool, if applicable. No additional management support is needed unless otherwise documented below in the visit note. 

## 2017-01-03 NOTE — Patient Instructions (Signed)
We will set up MRI to further assess your face tingling.

## 2017-01-03 NOTE — Progress Notes (Signed)
Subjective:     Patient ID: Gabrielle Brock, female   DOB: 05/18/1956, 61 y.o.   MRN: [id number]  HPI   Patient seen with chief complaint of "tingling" sensation right side of face within the past month. She had onset of cough few months ago which is slowly improving with more aggressive treatment of reflux. She still has sensation of frequent "mucus" in her throat. She relates about a month ago frequent tingling right side of face maxillary region. She had recent CT sinuses per pulmonary which showed no acute findings. No acute or chronic sinusitis. She's noticed some increased watering from her right eye but no visual change. No major headaches. She thinks she might have had a little bit of drooling of saliva from the right side of face but has not seen any definite weakness. She denies any numbness in her extremities. No pain. No rash. No speech changes.  Hypertension which has been running slightly high recently. She remains on Benicar for that  Past Medical History:  Diagnosis Date  . Arthritis   . Chronic headaches   . Depression   . Family history of adverse reaction to anesthesia    father had CABG age 38's and very hard to wake up from surgery  . GERD (gastroesophageal reflux disease)   . History of concussion 20 YRS AGO  . Hyperlipidemia   . Hypertension   . MVP (mitral valve prolapse) ASYMPTOMATIC  . Osteoporosis   . Seasonal allergies   . TMJ (dislocation of temporomandibular joint)    Past Surgical History:  Procedure Laterality Date  . BREAST BIOPSY  2006   RIGHT BREAST -  BENIGN  . DILATION AND CURETTAGE OF UTERUS  2013  . ESOPHAGOGASTRODUODENOSCOPY (EGD) WITH PROPOFOL N/A 02/21/2016   Procedure: ESOPHAGOGASTRODUODENOSCOPY (EGD) WITH PROPOFOL;  Surgeon: Arta Silence, MD;  Location: [facility];  Service: Endoscopy;  Laterality: N/A;  . HYSTEROSCOPY W/D&C  11/27/2011   Procedure: DILATATION AND CURETTAGE /HYSTEROSCOPY;  Surgeon: Margarette Asal, MD;  Location: [facility];  Service: Gynecology;  Laterality: N/A;  . LASER RETINAL REPAIR LEFT EYE  20 YRS AGO  . STRABISMUS SURGERY  AGE 62    reports that she has never smoked. She has never used smokeless tobacco. She reports that she drinks alcohol. She reports that she does not use drugs. family history includes Alzheimer's disease in her mother; Heart disease in her other; Heart disease (age of onset: 56) in her father; Hyperlipidemia in her other; Hypertension in her other; Parkinsonism in her father. Allergies  Allergen Reactions  . Advil [Ibuprofen] Other (See Comments)    Lips swell  . Cefaclor Hives     Review of Systems  Constitutional: Negative for chills and fever.  HENT: Negative for congestion and sinus pressure.   Eyes: Negative for visual disturbance.  Respiratory: Negative for cough.   Cardiovascular: Negative for chest pain.  Neurological: Negative for dizziness, tremors, seizures, syncope, facial asymmetry, speech difficulty, weakness and headaches.  Psychiatric/Behavioral: Negative for confusion.       Objective:   Physical Exam  Constitutional: She is oriented to person, place, and time. She appears well-developed and well-nourished.  HENT:  Right Ear: External ear normal.  Left Ear: External ear normal.  Mouth/Throat: Oropharynx is clear and moist.  Eyes: EOM are normal. Pupils are equal, round, and reactive to light.  Neck: Neck supple. No thyromegaly present.  Cardiovascular: Normal rate and regular rhythm.   Pulmonary/Chest: Breath sounds normal. No respiratory distress.  She has no wheezes. She has no rales.  Lymphadenopathy:    She has no cervical adenopathy.  Neurological: She is alert and oriented to person, place, and time. She has normal reflexes. No cranial nerve deficit. Coordination normal.  Strength full upper extremities. Gait normal. No tremor. No facial muscle weakness.  Normal sensory with monofilament and dull touch.       Assessment:      Patient seen wilh one-month history of paresthesias and frequent tingling sensation right side of face which has become constant. She has nonfocal exam neurologically.  She denies any pain features to suggest trigeminal neuralgia. No evidence for Bell's palsy. She does not have any evidence objectively for facial motor involvement    Plan:     -We gave her option of observing for one month but she is very anxious to proceed with further evaluation. -MRI brain without contrast to further evaluate -Follow-up promptly for any weakness or other new symptoms  Eulas Post MD [facility] at [facility]

## 2017-01-05 ENCOUNTER — Encounter: Payer: Self-pay | Admitting: Family Medicine

## 2017-01-06 ENCOUNTER — Other Ambulatory Visit: Payer: Self-pay | Admitting: Internal Medicine

## 2017-01-07 ENCOUNTER — Telehealth: Payer: Self-pay | Admitting: Family Medicine

## 2017-01-07 ENCOUNTER — Inpatient Hospital Stay: Admission: RE | Admit: 2017-01-07 | Payer: 59 | Source: Ambulatory Visit

## 2017-01-07 ENCOUNTER — Encounter: Payer: Self-pay | Admitting: Family Medicine

## 2017-01-07 ENCOUNTER — Ambulatory Visit
Admission: RE | Admit: 2017-01-07 | Discharge: 2017-01-07 | Disposition: A | Discharge status: Home / Self Care | Payer: No Typology Code available for payment source | Source: Ambulatory Visit | Attending: Family Medicine | Admitting: Family Medicine

## 2017-01-07 DIAGNOSIS — R208 Other disturbances of skin sensation: Secondary | ICD-10-CM

## 2017-01-07 NOTE — Telephone Encounter (Signed)
Patient called to inquire about her increasing blood pressure readings. She does believe this may be d/t stress levels as she has a business trip coming up and her MRI today. She is concerned about the possible findings on the MRI. She was not sure if she should change Htn medications or not. Advised pt that I would send message to Dr. Elease Hashimoto for review and advice. She is also concerned about when she will receive MRI results and advised her that someone would contact her once Dr. Elease Hashimoto has reviewed results.   Dr. Elease Hashimoto - Please advise about Htn issues/medication. Thanks!

## 2017-01-07 NOTE — Telephone Encounter (Signed)
Dr. Elease Hashimoto pt would like to know if you would do a peer to peer for the MRI that she is scheduled to have done today at 3:20 the insurance company denied it and she state that they would like to have more detail why she needs the MRI done.

## 2017-01-07 NOTE — Telephone Encounter (Signed)
The patient was denied by her insurance to have Martin she is requesting Dr. Elease Hashimoto to call her insurance company [employer] to Do a peer-peer to get her MRI  approve [phone] Patient  ID [id number]. Office notes from 01-03-2017 Dr. Elease Hashimoto notes were sent from the over to provide clinical information, and test was still denied please advise .

## 2017-01-08 ENCOUNTER — Ambulatory Visit (INDEPENDENT_AMBULATORY_CARE_PROVIDER_SITE_OTHER): Payer: PRIVATE HEALTH INSURANCE | Admitting: Family Medicine

## 2017-01-08 ENCOUNTER — Encounter: Payer: Self-pay | Admitting: *Deleted

## 2017-01-08 VITALS — BP 140/90 | HR 86

## 2017-01-08 DIAGNOSIS — I10 Essential (primary) hypertension: Secondary | ICD-10-CM | POA: Diagnosis not present

## 2017-01-08 DIAGNOSIS — R202 Paresthesia of skin: Secondary | ICD-10-CM

## 2017-01-08 NOTE — Progress Notes (Signed)
Subjective:     Patient ID: Gabrielle Brock, female   DOB: [birth year], 61 y.o.   MRN: [id number]  HPI Patient here for the following issues:  Elevated blood pressure. She recently developed some right face tingling and numbness without associated weakness. She states that Saturday she had migraine headache. Her blood pressure was relatively normal. By Monday she noted blood pressure of 162/97 and eventually this went as high as 170/100. She normally takes Benicar 5 mg daily and she took 2 extra doses for a total 15 mg without any improvement in blood pressure. Tuesday morning she had blood pressure 123/82 and then later in the day was 156/92. She took 10 mg of Benicar yesterday and today and again today noted her blood pressure somewhat elevated. She describes a "pressure" sensation mid forehead but no consistent headache. She still was able to swim yesterday and earlier today. She was placed several years ago on Benicar 20 no daily but her blood pressure actually dropped too low with that dosage. No recent dietary changes. No consistent alcohol use. No nonsteroidal use.  Patient came in last week with tingling sensation right face and question of some drooling out of the right side of mouth although we did not appreciate any definite motor weakness. She had MRI scan of the brain yesterday and this came back basically normal. No findings to explain her right-sided facial symptoms. She's not had any extremity weakness or numbness.  Past Medical History:  Diagnosis Date  . Arthritis   . Chronic headaches   . Depression   . Family history of adverse reaction to anesthesia    father had CABG age 29's and very hard to wake up from surgery  . GERD (gastroesophageal reflux disease)   . History of concussion 20 YRS AGO  . Hyperlipidemia   . Hypertension   . MVP (mitral valve prolapse) ASYMPTOMATIC  . Osteoporosis   . Seasonal allergies   . TMJ (dislocation of temporomandibular joint)    Past Surgical  History:  Procedure Laterality Date  . BREAST BIOPSY  2006   RIGHT BREAST -  BENIGN  . DILATION AND CURETTAGE OF UTERUS  2013  . ESOPHAGOGASTRODUODENOSCOPY (EGD) WITH PROPOFOL N/A 02/21/2016   Procedure: ESOPHAGOGASTRODUODENOSCOPY (EGD) WITH PROPOFOL;  Surgeon: Arta Silence, MD;  Location: [facility];  Service: Endoscopy;  Laterality: N/A;  . HYSTEROSCOPY W/D&C  11/27/2011   Procedure: DILATATION AND CURETTAGE /HYSTEROSCOPY;  Surgeon: Margarette Asal, MD;  Location: [facility];  Service: Gynecology;  Laterality: N/A;  . LASER RETINAL REPAIR LEFT EYE  20 YRS AGO  . STRABISMUS SURGERY  AGE 38    reports that she has never smoked. She has never used smokeless tobacco. She reports that she drinks alcohol. She reports that she does not use drugs. family history includes Alzheimer's disease in her mother; Heart disease in her other; Heart disease (age of onset: 88) in her father; Hyperlipidemia in her other; Hypertension in her other; Parkinsonism in her father. Allergies  Allergen Reactions  . Advil [Ibuprofen] Other (See Comments)    Lips swell  . Cefaclor Hives     Review of Systems  Constitutional: Negative for fatigue.  Eyes: Negative for visual disturbance.  Respiratory: Negative for cough, chest tightness, shortness of breath and wheezing.   Cardiovascular: Negative for chest pain, palpitations and leg swelling.  Genitourinary: Negative for dysuria.  Neurological: Positive for numbness. Negative for dizziness, seizures, syncope, weakness, light-headedness and headaches.  Hematological: Negative for adenopathy.  Psychiatric/Behavioral:  Negative for confusion.       Objective:   Physical Exam  Constitutional: She is oriented to person, place, and time. She appears well-developed and well-nourished.  Eyes: Pupils are equal, round, and reactive to light.  Neck: Neck supple. No JVD present. No thyromegaly present.  Cardiovascular: Normal rate and regular rhythm.   Exam reveals no gallop.   Pulmonary/Chest: Effort normal and breath sounds normal. No respiratory distress. She has no wheezes. She has no rales.  Musculoskeletal: She exhibits no edema.  Neurological: She is alert and oriented to person, place, and time. No cranial nerve deficit. Coordination normal.  Skin: No rash noted.       Assessment:     #1 hypertension currently poorly controlled. Repeat reading left arm seated after rest 158/90  #2 recent onset of paresthesias right side of face without any focal neurologic findings on exam and recent MRI the brain no acute findings    Plan:     -Reassurance and observation regarding right facial findings -Increase Benicar to 20 mg once daily -Office follow-up in 3 weeks to reassess blood pressure -She is encouraged to check this occasionally but not to over check as she has tendency to get very anxious with excessive monitoring. -Continue regular aerobic exercise such as swimming as tolerated.  Eulas Post MD [facility] at [facility]

## 2017-01-08 NOTE — Patient Instructions (Signed)
Go ahead and increase the Benicar to 20 mg daily Check BP 2-3 times daily

## 2017-01-08 NOTE — Progress Notes (Signed)
Pre visit review using our clinic review tool, if applicable. No additional management support is needed unless otherwise documented below in the visit note. 

## 2017-01-13 ENCOUNTER — Encounter: Payer: Self-pay | Admitting: Family Medicine

## 2017-01-15 NOTE — Telephone Encounter (Signed)
Let her know I contacted insurance company yesterday and they are unfortunately not going to cover MRI based on her symptoms.

## 2017-01-23 ENCOUNTER — Encounter: Payer: Self-pay | Admitting: Family Medicine

## 2017-01-23 ENCOUNTER — Other Ambulatory Visit: Payer: Self-pay | Admitting: *Deleted

## 2017-01-23 MED ORDER — ESOMEPRAZOLE MAGNESIUM 40 MG PO CPDR: 40.0000 mg | DELAYED_RELEASE_CAPSULE | ORAL | 0 refills | Status: DC

## 2017-01-23 NOTE — Telephone Encounter (Signed)
Rx done and the pt was notified via Mychart message. 

## 2017-01-29 ENCOUNTER — Ambulatory Visit: Payer: Self-pay | Admitting: Family Medicine

## 2017-02-14 ENCOUNTER — Other Ambulatory Visit: Payer: Self-pay | Admitting: *Deleted

## 2017-02-14 ENCOUNTER — Encounter: Payer: Self-pay | Admitting: Family Medicine

## 2017-02-14 ENCOUNTER — Ambulatory Visit (INDEPENDENT_AMBULATORY_CARE_PROVIDER_SITE_OTHER): Payer: PRIVATE HEALTH INSURANCE | Admitting: Family Medicine

## 2017-02-14 VITALS — BP 110/72 | HR 102 | Temp 98.3°F | Wt 105.2 lb

## 2017-02-14 DIAGNOSIS — I10 Essential (primary) hypertension: Secondary | ICD-10-CM | POA: Diagnosis not present

## 2017-02-14 MED ORDER — ESOMEPRAZOLE MAGNESIUM 40 MG PO CPDR: 40.0000 mg | DELAYED_RELEASE_CAPSULE | ORAL | 3 refills | Status: AC

## 2017-02-14 NOTE — Progress Notes (Signed)
Subjective:     Patient ID: Gabrielle Brock, female   DOB: 22-Jun-1956, 61 y.o.   MRN: [id number]  HPI Patient here for follow-up hypertension. She has very labile blood pressure and tends to go up fairly significantly with stress which she attributes mostly to work. She is currently taking low-dose Benicar 5 mg once daily. She occasionally titrates this up to 10 mg when under increased stress. This seems to be controlling her blood pressure well. She's not had any recent lightheadedness or dizziness. No headaches. No chest pains.exercises regularly.  Past Medical History:  Diagnosis Date  . Arthritis   . Chronic headaches   . Depression   . Family history of adverse reaction to anesthesia    father had CABG age 62's and very hard to wake up from surgery  . GERD (gastroesophageal reflux disease)   . History of concussion 20 YRS AGO  . Hyperlipidemia   . Hypertension   . MVP (mitral valve prolapse) ASYMPTOMATIC  . Osteoporosis   . Seasonal allergies   . TMJ (dislocation of temporomandibular joint)    Past Surgical History:  Procedure Laterality Date  . BREAST BIOPSY  2006   RIGHT BREAST -  BENIGN  . DILATION AND CURETTAGE OF UTERUS  2013  . ESOPHAGOGASTRODUODENOSCOPY (EGD) WITH PROPOFOL N/A 02/21/2016   Procedure: ESOPHAGOGASTRODUODENOSCOPY (EGD) WITH PROPOFOL;  Surgeon: Arta Silence, MD;  Location: [facility];  Service: Endoscopy;  Laterality: N/A;  . HYSTEROSCOPY W/D&C  11/27/2011   Procedure: DILATATION AND CURETTAGE /HYSTEROSCOPY;  Surgeon: Margarette Asal, MD;  Location: [facility];  Service: Gynecology;  Laterality: N/A;  . LASER RETINAL REPAIR LEFT EYE  20 YRS AGO  . STRABISMUS SURGERY  AGE 19    reports that she has never smoked. She has never used smokeless tobacco. She reports that she drinks alcohol. She reports that she does not use drugs. family history includes Alzheimer's disease in her mother; Heart disease in her other; Heart disease (age of onset:  100) in her father; Hyperlipidemia in her other; Hypertension in her other; Parkinsonism in her father. Allergies  Allergen Reactions  . Advil [Ibuprofen] Other (See Comments)    Lips swell  . Cefaclor Hives     Review of Systems  Constitutional: Negative for fatigue.  Eyes: Negative for visual disturbance.  Respiratory: Negative for cough, chest tightness, shortness of breath and wheezing.   Cardiovascular: Negative for chest pain, palpitations and leg swelling.  Neurological: Negative for dizziness, seizures, syncope, weakness, light-headedness and headaches.       Objective:   Physical Exam  Constitutional: She appears well-developed and well-nourished.  Eyes: Pupils are equal, round, and reactive to light.  Neck: Neck supple. No JVD present. No thyromegaly present.  Cardiovascular: Normal rate and regular rhythm.  Exam reveals no gallop.   Pulmonary/Chest: Effort normal and breath sounds normal. No respiratory distress. She has no wheezes. She has no rales.  Musculoskeletal: She exhibits no edema.  Neurological: She is alert.       Assessment:     Hypertension- stable.    Plan:     -continue with Benicar 5 mg daily -continue with regular aerobic exercise -set up CPE.  Eulas Post MD [place] at [facility]

## 2017-02-14 NOTE — Progress Notes (Signed)
Pre visit review using our clinic review tool, if applicable. No additional management support is needed unless otherwise documented below in the visit note. 

## 2017-02-14 NOTE — Patient Instructions (Signed)
Set up physical exam. 

## 2017-02-20 ENCOUNTER — Encounter: Payer: Self-pay | Admitting: Family Medicine

## 2017-02-21 ENCOUNTER — Encounter: Payer: Self-pay | Admitting: Family Medicine

## 2017-02-21 ENCOUNTER — Other Ambulatory Visit: Payer: Self-pay | Admitting: Family Medicine

## 2017-03-05 ENCOUNTER — Telehealth: Payer: Self-pay | Admitting: Family Medicine

## 2017-03-05 NOTE — Telephone Encounter (Signed)
Obtained Authorization number to get her MRI of brain approved and it is [id number]   This will need to be submitted to radiology.

## 2017-03-05 NOTE — Telephone Encounter (Signed)
Authorization number given to [facility].

## 2017-04-04 ENCOUNTER — Telehealth: Payer: Self-pay | Admitting: Family Medicine

## 2017-04-04 DIAGNOSIS — K219 Gastro-esophageal reflux disease without esophagitis: Secondary | ICD-10-CM

## 2017-04-04 DIAGNOSIS — I10 Essential (primary) hypertension: Secondary | ICD-10-CM

## 2017-04-04 NOTE — Telephone Encounter (Signed)
Pt would like to have her cpx labs and vit d level drawn at [place]. Pt is aware insurance may not pay for vit d blood work. Pt does not want to make one trip and  come in fasting . The vit d is a blood work her company is requesting for her to have. Pt does have order. Pt is sch for cpx on 04-14-17. Pt is aware md out of office until monday

## 2017-04-07 NOTE — Telephone Encounter (Signed)
Orders placed.

## 2017-04-07 NOTE — Telephone Encounter (Signed)
Okay to order?

## 2017-04-07 NOTE — Telephone Encounter (Signed)
OK 

## 2017-04-08 ENCOUNTER — Other Ambulatory Visit (INDEPENDENT_AMBULATORY_CARE_PROVIDER_SITE_OTHER): Payer: PRIVATE HEALTH INSURANCE

## 2017-04-08 ENCOUNTER — Encounter: Payer: Self-pay | Admitting: Family Medicine

## 2017-04-08 ENCOUNTER — Other Ambulatory Visit: Payer: Self-pay | Admitting: Gastroenterology

## 2017-04-08 DIAGNOSIS — R196 Halitosis: Secondary | ICD-10-CM

## 2017-04-08 DIAGNOSIS — R198 Other specified symptoms and signs involving the digestive system and abdomen: Secondary | ICD-10-CM

## 2017-04-08 DIAGNOSIS — K219 Gastro-esophageal reflux disease without esophagitis: Secondary | ICD-10-CM

## 2017-04-08 DIAGNOSIS — R0989 Other specified symptoms and signs involving the circulatory and respiratory systems: Secondary | ICD-10-CM

## 2017-04-08 DIAGNOSIS — I10 Essential (primary) hypertension: Secondary | ICD-10-CM | POA: Diagnosis not present

## 2017-04-08 DIAGNOSIS — R09A2 Foreign body sensation, throat: Secondary | ICD-10-CM

## 2017-04-08 DIAGNOSIS — R1310 Dysphagia, unspecified: Secondary | ICD-10-CM

## 2017-04-08 LAB — URINALYSIS, ROUTINE W REFLEX MICROSCOPIC
BILIRUBIN URINE: NEGATIVE
Hgb urine dipstick: NEGATIVE
KETONES UR: NEGATIVE
LEUKOCYTES UA: NEGATIVE
NITRITE: NEGATIVE
Specific Gravity, Urine: <=1.005 — AB (ref 1.000–1.030)
Total Protein, Urine: NEGATIVE
URINE GLUCOSE: NEGATIVE
UROBILINOGEN UA: 0.2 (ref 0.0–1.0)
pH: 6 (ref 5.0–8.0)

## 2017-04-08 LAB — CBC WITH DIFFERENTIAL/PLATELET
BASOS ABS: 0.1 10*3/uL (ref 0.0–0.1)
Basophils Relative: 1.4 % (ref 0.0–3.0)
EOS ABS: 0.8 10*3/uL — AB (ref 0.0–0.7)
Eosinophils Relative: 8.5 % — ABNORMAL HIGH (ref 0.0–5.0)
HCT: 37.5 % (ref 36.0–46.0)
Hemoglobin: 12.6 g/dL (ref 12.0–15.0)
LYMPHS ABS: 3.6 10*3/uL (ref 0.7–4.0)
Lymphocytes Relative: 37.8 % (ref 12.0–46.0)
MCHC: 33.6 g/dL (ref 30.0–36.0)
MCV: 88.7 fl (ref 78.0–100.0)
MONO ABS: 0.7 10*3/uL (ref 0.1–1.0)
MONOS PCT: 6.9 % (ref 3.0–12.0)
NEUTROS ABS: 4.3 10*3/uL (ref 1.4–7.7)
Neutrophils Relative %: 45.4 % (ref 43.0–77.0)
PLATELETS: 293 10*3/uL (ref 150.0–400.0)
RBC: 4.23 Mil/uL (ref 3.87–5.11)
RDW: 13.7 % (ref 11.5–15.5)
WBC: 9.6 10*3/uL (ref 4.0–10.5)

## 2017-04-08 LAB — LIPID PANEL
CHOLESTEROL: 205 mg/dL — AB (ref 0–200)
HDL: 71.4 mg/dL (ref >39.00)
LDL CALC: 122 mg/dL — AB (ref 0–99)
NonHDL: 133.9
TRIGLYCERIDES: 58 mg/dL (ref 0.0–149.0)
Total CHOL/HDL Ratio: 3
VLDL: 11.6 mg/dL (ref 0.0–40.0)

## 2017-04-08 LAB — TSH: TSH: 1.67 u[IU]/mL (ref 0.35–4.50)

## 2017-04-08 LAB — BASIC METABOLIC PANEL
BUN: 15 mg/dL (ref 6–23)
CALCIUM: 9.4 mg/dL (ref 8.4–10.5)
CO2: 31 meq/L (ref 19–32)
Chloride: 105 mEq/L (ref 96–112)
Creatinine, Ser: 0.88 mg/dL (ref 0.40–1.20)
GFR: 69.88 mL/min (ref >60.00)
GLUCOSE: 92 mg/dL (ref 70–99)
POTASSIUM: 4 meq/L (ref 3.5–5.1)
SODIUM: 143 meq/L (ref 135–145)

## 2017-04-08 LAB — HEPATIC FUNCTION PANEL
ALBUMIN: 4.3 g/dL (ref 3.5–5.2)
ALK PHOS: 57 U/L (ref 39–117)
ALT: 14 U/L (ref 0–35)
AST: 19 U/L (ref 0–37)
BILIRUBIN DIRECT: 0.1 mg/dL (ref 0.0–0.3)
TOTAL PROTEIN: 7 g/dL (ref 6.0–8.3)
Total Bilirubin: 0.4 mg/dL (ref 0.2–1.2)

## 2017-04-08 LAB — VITAMIN D 25 HYDROXY (VIT D DEFICIENCY, FRACTURES): VITD: 33.14 ng/mL (ref 30.00–100.00)

## 2017-04-09 ENCOUNTER — Ambulatory Visit
Admission: RE | Admit: 2017-04-09 | Discharge: 2017-04-09 | Disposition: A | Discharge status: Home / Self Care | Payer: 59 | Source: Ambulatory Visit | Attending: Gastroenterology | Admitting: Gastroenterology

## 2017-04-09 DIAGNOSIS — R1310 Dysphagia, unspecified: Secondary | ICD-10-CM

## 2017-04-09 DIAGNOSIS — R196 Halitosis: Secondary | ICD-10-CM

## 2017-04-09 DIAGNOSIS — R198 Other specified symptoms and signs involving the digestive system and abdomen: Secondary | ICD-10-CM

## 2017-04-09 DIAGNOSIS — R0989 Other specified symptoms and signs involving the circulatory and respiratory systems: Secondary | ICD-10-CM

## 2017-04-14 ENCOUNTER — Encounter: Payer: Self-pay | Admitting: Family Medicine

## 2017-04-14 ENCOUNTER — Ambulatory Visit (INDEPENDENT_AMBULATORY_CARE_PROVIDER_SITE_OTHER): Payer: 59 | Admitting: Family Medicine

## 2017-04-14 VITALS — BP 110/74 | HR 98 | Temp 98.5°F | Ht 63.5 in | Wt 102.5 lb

## 2017-04-14 DIAGNOSIS — Z0000 Encounter for general adult medical examination without abnormal findings: Secondary | ICD-10-CM | POA: Diagnosis not present

## 2017-04-14 NOTE — Progress Notes (Signed)
Subjective:     Patient ID: Gabrielle Brock, female   DOB: 1956-08-14, 61 y.o.   MRN: [id number]  HPI Patient seen for physical exam. She sees gynecologist for Pap smears and mammograms. Her previous GYN retired and she is looking to establish with someone else in. Her last tetanus was over 10 years ago but she defers getting this today. She will be due for repeat mammogram this summer. No history of known hepatitis C screening. Colonoscopy up-to-date.  She has hypertension which has been controlled. Exercises regularly. Never smoked. No regular alcohol use.  Continues to have some occasional sense of globus and has had extensive GI workup including recent barium swallow study which came back normal. This was ordered per GI and was done just 5 days ago. Denies any food dysphagia. Sometimes has difficulty swallowing tablets.  Past Medical History:  Diagnosis Date  . Arthritis   . Chronic headaches   . Depression   . Family history of adverse reaction to anesthesia    father had CABG age 4's and very hard to wake up from surgery  . GERD (gastroesophageal reflux disease)   . History of concussion 20 YRS AGO  . Hyperlipidemia   . Hypertension   . MVP (mitral valve prolapse) ASYMPTOMATIC  . Osteoporosis   . Seasonal allergies   . TMJ (dislocation of temporomandibular joint)    Past Surgical History:  Procedure Laterality Date  . BREAST BIOPSY  2006   RIGHT BREAST -  BENIGN  . DILATION AND CURETTAGE OF UTERUS  2013  . ESOPHAGOGASTRODUODENOSCOPY (EGD) WITH PROPOFOL N/A 02/21/2016   Procedure: ESOPHAGOGASTRODUODENOSCOPY (EGD) WITH PROPOFOL;  Surgeon: Arta Silence, MD;  Location: [facility];  Service: Endoscopy;  Laterality: N/A;  . HYSTEROSCOPY W/D&C  11/27/2011   Procedure: DILATATION AND CURETTAGE /HYSTEROSCOPY;  Surgeon: Margarette Asal, MD;  Location: [facility];  Service: Gynecology;  Laterality: N/A;  . LASER RETINAL REPAIR LEFT EYE  20 YRS AGO  . STRABISMUS  SURGERY  AGE 65    reports that she has never smoked. She has never used smokeless tobacco. She reports that she drinks alcohol. She reports that she does not use drugs. family history includes Alzheimer's disease in her mother; Heart disease in her other; Heart disease (age of onset: 56) in her father; Hyperlipidemia in her other; Hypertension in her other; Parkinsonism in her father. Allergies  Allergen Reactions  . Advil [Ibuprofen] Other (See Comments)    Lips swell  . Cefaclor Hives      Review of Systems  Constitutional: Negative for activity change, appetite change, fatigue, fever and unexpected weight change.  HENT: Negative for ear pain, hearing loss, sore throat and trouble swallowing.   Eyes: Negative for visual disturbance.  Respiratory: Negative for cough and shortness of breath.   Cardiovascular: Negative for chest pain and palpitations.  Gastrointestinal: Negative for abdominal pain, blood in stool, constipation and diarrhea.  Endocrine: Negative for polydipsia and polyuria.  Genitourinary: Negative for dysuria and hematuria.  Musculoskeletal: Negative for arthralgias, back pain and myalgias.  Skin: Negative for rash.  Neurological: Negative for dizziness, syncope and headaches.  Hematological: Negative for adenopathy.  Psychiatric/Behavioral: Negative for confusion and dysphoric mood.       Objective:   Physical Exam  Constitutional: She is oriented to person, place, and time. She appears well-developed and well-nourished.  HENT:  Head: Normocephalic and atraumatic.  Mouth/Throat: Oropharynx is clear and moist.  Eyes: EOM are normal. Pupils are equal, round, and reactive  to light.  Neck: Normal range of motion. Neck supple. No thyromegaly present.  Cardiovascular: Normal rate, regular rhythm and normal heart sounds.   No murmur heard. Pulmonary/Chest: Breath sounds normal. No respiratory distress. She has no wheezes. She has no rales.  Abdominal: Soft. Bowel  sounds are normal. She exhibits no distension and no mass. There is no tenderness. There is no rebound and no guarding.  Genitourinary:  Genitourinary Comments: Per gyn  Musculoskeletal: Normal range of motion. She exhibits no edema.  Lymphadenopathy:    She has no cervical adenopathy.  Neurological: She is alert and oriented to person, place, and time. She displays normal reflexes. No cranial nerve deficit.  Skin: No rash noted.  Psychiatric: She has a normal mood and affect. Her behavior is normal. Judgment and thought content normal.       Assessment:     Physical exam-generally healthy 61 year old female with well-controlled hypertension    Plan:     -Labs reviewed with no major concerns -Patient needs tetanus booster and she prefers to return later for that -Set up repeat mammogram this summer -Recommended hepatitis C screening with future labs ordered -Patient will reestablish with new GYN -Continue regular weightbearing exercise along with daily calcium and vitamin D supplementation  Eulas Post MD [facility]

## 2017-04-14 NOTE — Patient Instructions (Signed)
Need to get Tdap (tetanus) later this year. We need to check Hep C antibody this year.   Repeat mammogram this year.

## 2017-04-25 ENCOUNTER — Encounter: Payer: Self-pay | Admitting: Family Medicine

## 2017-05-03 ENCOUNTER — Other Ambulatory Visit: Payer: Self-pay | Admitting: Family Medicine

## 2017-05-05 NOTE — Telephone Encounter (Signed)
Refill once.  #60  Avoid regular use.

## 2017-05-05 NOTE — Telephone Encounter (Signed)
Last refill 07/19/16 and last office visit 04/14/17.  Okay to refill?

## 2017-07-17 ENCOUNTER — Encounter: Payer: Self-pay | Admitting: Family Medicine

## 2017-07-20 ENCOUNTER — Other Ambulatory Visit: Payer: Self-pay | Admitting: Family Medicine

## 2017-10-10 IMAGING — RF DG UGI W/ HIGH DENSITY W/KUB
17 of 21 series · 17 of 21 positions shown · non-contrast
Comparison: None.

CLINICAL DATA: Bloating and abdominal pain after meals.

EXAM:
UPPER GI SERIES WITH KUB
TECHNIQUE: After obtaining a scout radiograph a routine upper GI series was
performed using thin and high density barium.
FLUOROSCOPY TIME:  Radiation Exposure Index (as provided by the
fluoroscopic device): Not available on this device
If the device does not provide the exposure index:
Fluoroscopy Time (in minutes and seconds):  3 minutes 12 seconds
Number of Acquired Images:  5, the rest saved fluoroscopy

[Series 1: run · 1 of 1 slices shown (1 of 16)]
[im 1/1]
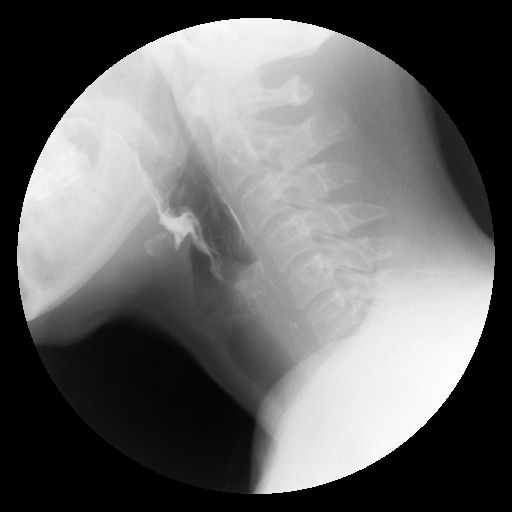

[Series 2: run · 1 of 1 slices shown (2 of 16)]
[im 1/1]
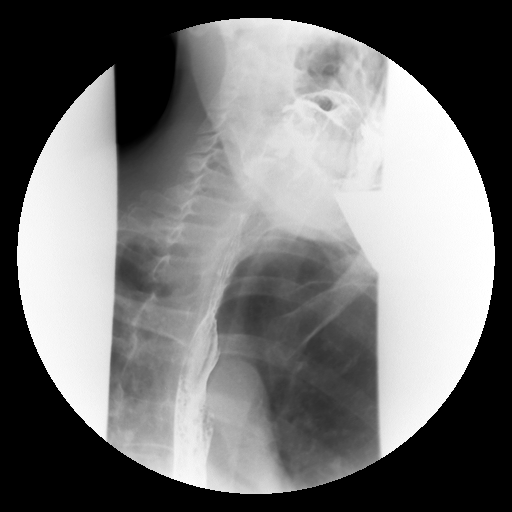

[Series 4: run · 1 of 1 slices shown (3 of 16)]
[im 1/1]
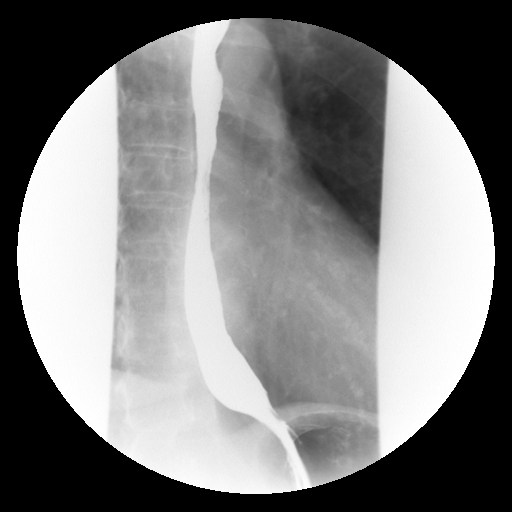

[Series 5: run · 1 of 1 slices shown (4 of 16)]
[im 1/1]
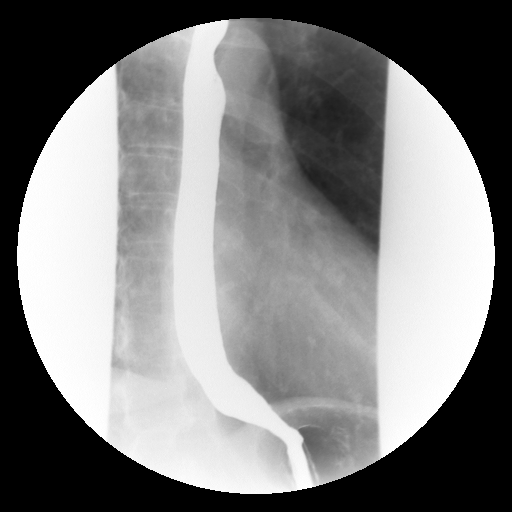

[Series 6: run · 1 of 1 slices shown (5 of 16)]
[im 1/1]
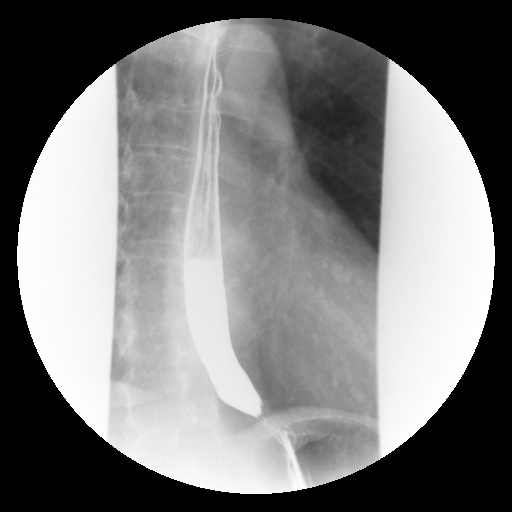

[Series 7: run · 1 of 1 slices shown (6 of 16)]
[im 1/1]
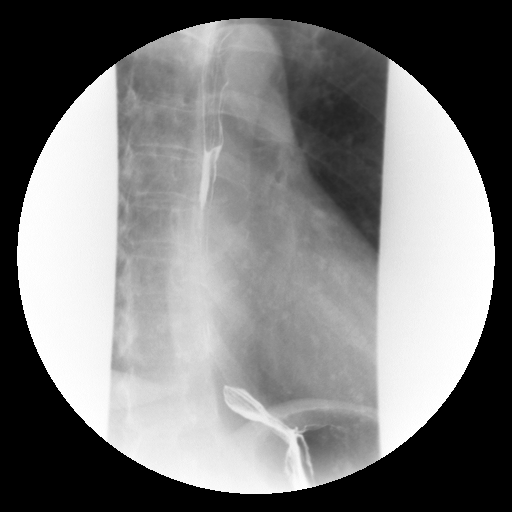

[Series 9: run · 1 of 1 slices shown (7 of 16)]
[im 1/1]
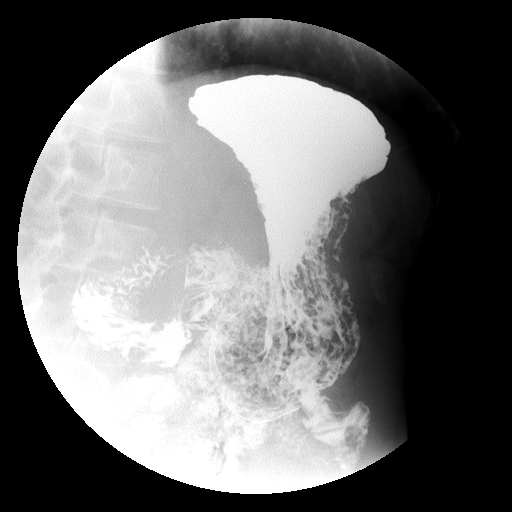

[Series 10: run · 1 of 1 slices shown (8 of 16)]
[im 1/1]
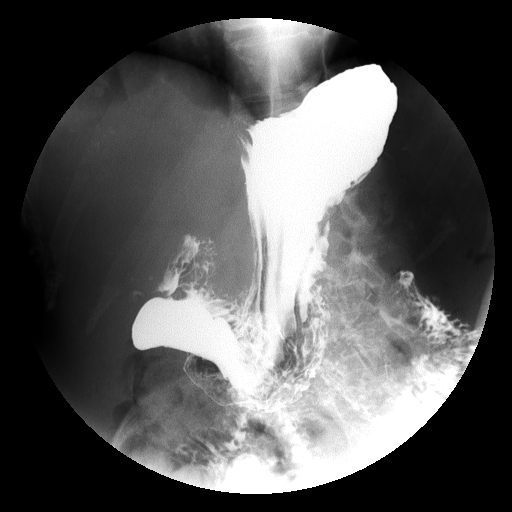

[Series 11: run · 1 of 1 slices shown (9 of 16)]
[im 1/1]
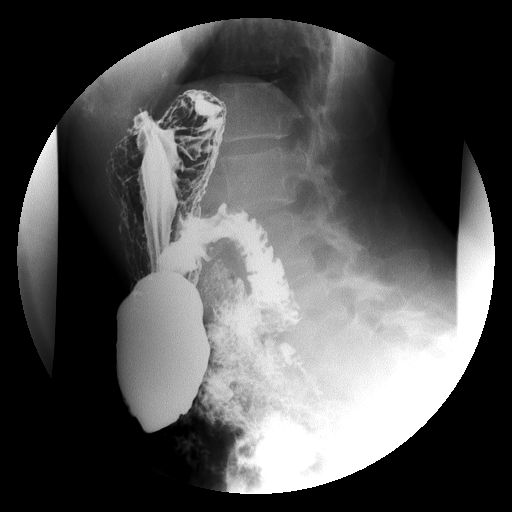

[Series 12: run · 1 of 1 slices shown (10 of 16)]
[im 1/1]
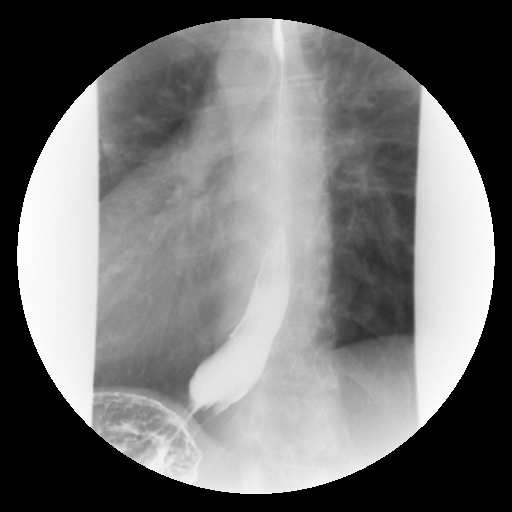

[Series 13: run · 1 of 1 slices shown (11 of 16)]
[im 1/1]
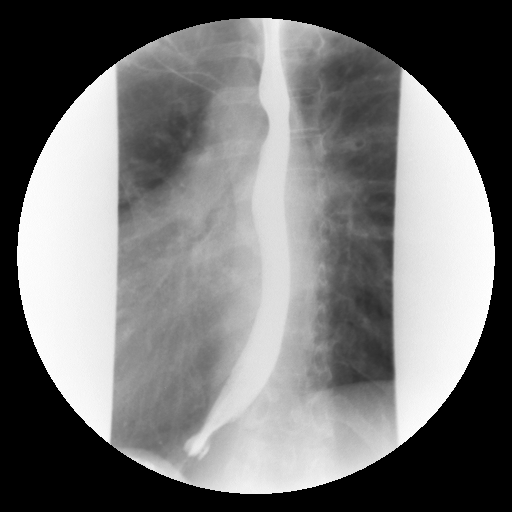

[Series 15: run · 1 of 1 slices shown (12 of 16)]
[im 1/1]
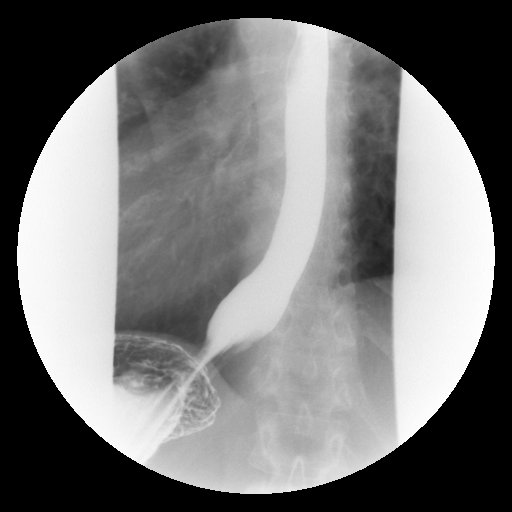

[Series 16: run · 1 of 1 slices shown (13 of 16)]
[im 1/1]
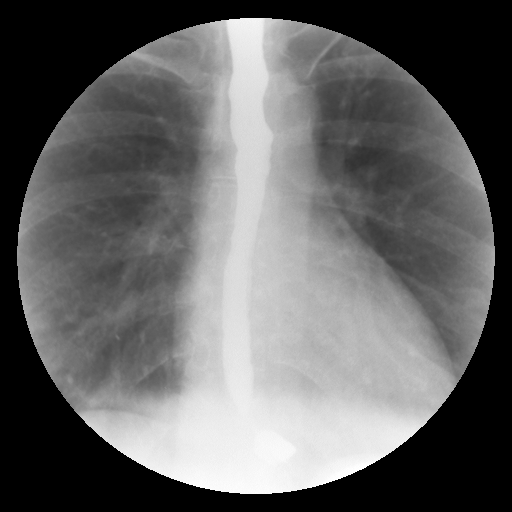

[Series 17: run · 1 of 1 slices shown (14 of 16)]
[im 1/1]
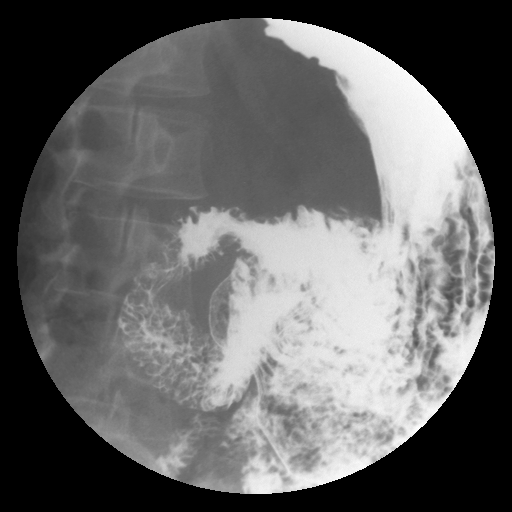

[Series 18: run · 1 of 1 slices shown (15 of 16)]
[im 1/1]
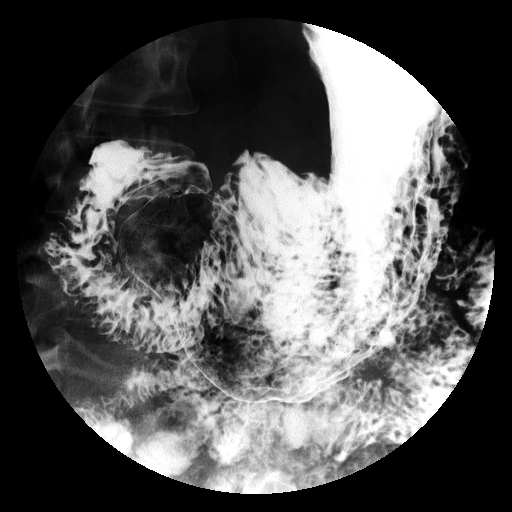

[Series 20: run · 1 of 1 slices shown (16 of 16)]
[im 1/1]
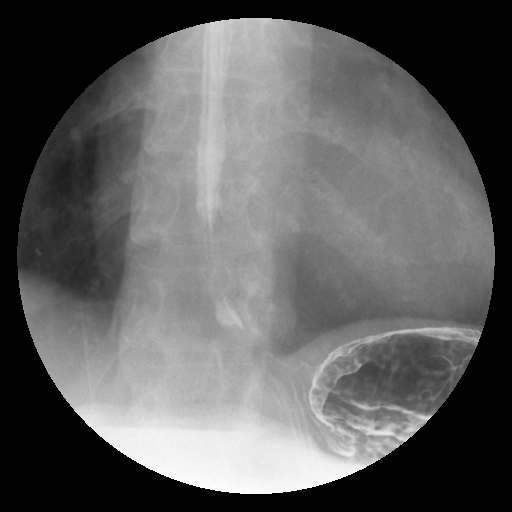

[Series 1001: view not recorded · 0.20mm/px · 1 of 1 slices shown]
[im 1/1]
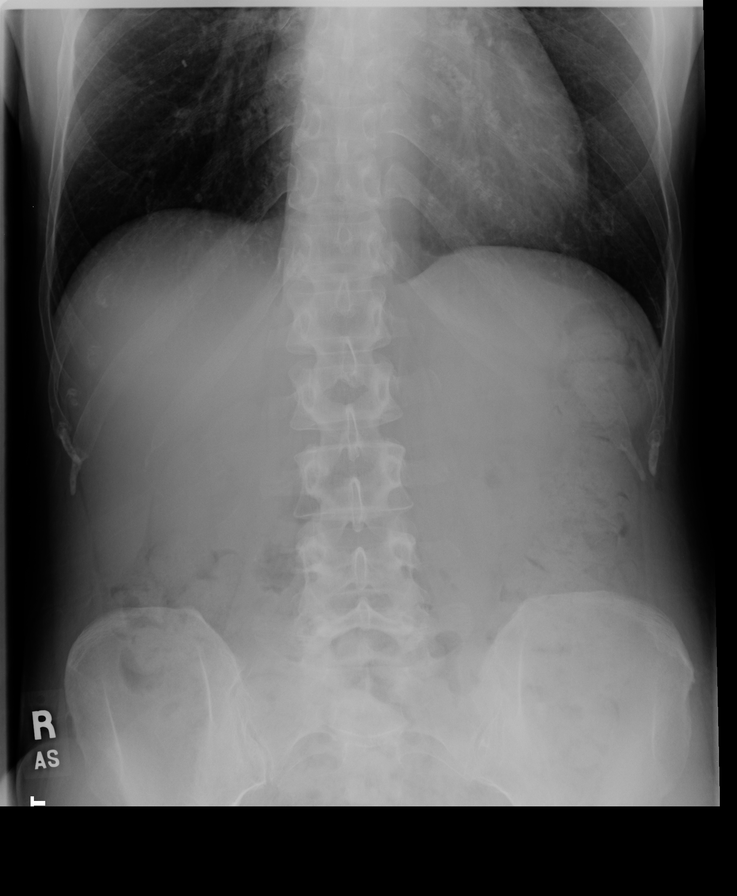

[17 of 21 positions shown; findings below may reference images not displayed]

FINDINGS: Lateral pharyngeal imaging was normal. This was viewed with
real-time fluoroscopy. Exposures were not obtained to limit
radiation exposure in this young patient. Specifically there is no
aspiration, laryngeal penetration, obstructive process, or
diverticulum.

The patient had difficulty swallowing and maintaining the gas
crystals and double contrast esophageal distention with sub optimal.
No mass lesion, ring, hernia, or ulceration was seen. No indication
of fold thickening. The primary propulsion wave was vigorous.
Esophageal stasis was seen presumably from nonvisualized reflux.

Suboptimal distention of the stomach as above. Stomach shape is
normal. No mass lesion or ulceration was seen. No rugal thickening
when allowing for underdistention. The duodenal C-loop has a normal
appearance. Normal gastric emptying.

Formed stool distends most colonic segments.
IMPRESSION: 1. Gastroesophageal reflux.
2. Negative under distended stomach.
3. Large stool volume.
